# Patient Record
Sex: Female | Born: 1940 | Race: White | Hispanic: No | Marital: Married | Smoking: Former smoker
Health system: Southern US, Community
[De-identification: ages and names within clinical notes are randomized; demographics above are authoritative.]

## PROBLEM LIST (undated history)

## (undated) DIAGNOSIS — J479 Bronchiectasis, uncomplicated: Secondary | ICD-10-CM

## (undated) HISTORY — PX: ABDOMINAL HYSTERECTOMY: SHX81

## (undated) HISTORY — PX: URETHRAL SLING: SHX2621

---

## 2014-05-04 ENCOUNTER — Emergency Department (HOSPITAL_COMMUNITY): Payer: Medicare Other

## 2014-05-04 ENCOUNTER — Encounter (HOSPITAL_COMMUNITY): Payer: Self-pay | Admitting: Emergency Medicine

## 2014-05-04 ENCOUNTER — Inpatient Hospital Stay (HOSPITAL_COMMUNITY): Payer: Medicare Other

## 2014-05-04 ENCOUNTER — Inpatient Hospital Stay (HOSPITAL_COMMUNITY)
Admission: EM | Admit: 2014-05-04 | Discharge: 2014-05-06 | DRG: 069 | Disposition: A | Discharge status: Home / Self Care | Payer: Medicare Other | Attending: Internal Medicine | Admitting: Internal Medicine

## 2014-05-04 DIAGNOSIS — M949 Disorder of cartilage, unspecified: Secondary | ICD-10-CM

## 2014-05-04 DIAGNOSIS — J479 Bronchiectasis, uncomplicated: Secondary | ICD-10-CM | POA: Diagnosis present

## 2014-05-04 DIAGNOSIS — G459 Transient cerebral ischemic attack, unspecified: Secondary | ICD-10-CM

## 2014-05-04 DIAGNOSIS — R000 Tachycardia, unspecified: Secondary | ICD-10-CM | POA: Diagnosis present

## 2014-05-04 DIAGNOSIS — Z7982 Long term (current) use of aspirin: Secondary | ICD-10-CM

## 2014-05-04 DIAGNOSIS — M899 Disorder of bone, unspecified: Secondary | ICD-10-CM | POA: Diagnosis present

## 2014-05-04 DIAGNOSIS — I498 Other specified cardiac arrhythmias: Secondary | ICD-10-CM | POA: Diagnosis present

## 2014-05-04 DIAGNOSIS — E876 Hypokalemia: Secondary | ICD-10-CM | POA: Diagnosis present

## 2014-05-04 DIAGNOSIS — M6281 Muscle weakness (generalized): Secondary | ICD-10-CM

## 2014-05-04 DIAGNOSIS — Z87891 Personal history of nicotine dependence: Secondary | ICD-10-CM

## 2014-05-04 DIAGNOSIS — R531 Weakness: Secondary | ICD-10-CM

## 2014-05-04 DIAGNOSIS — D72829 Elevated white blood cell count, unspecified: Secondary | ICD-10-CM | POA: Diagnosis present

## 2014-05-04 DIAGNOSIS — Z79899 Other long term (current) drug therapy: Secondary | ICD-10-CM

## 2014-05-04 DIAGNOSIS — K5289 Other specified noninfective gastroenteritis and colitis: Secondary | ICD-10-CM | POA: Diagnosis present

## 2014-05-04 HISTORY — DX: Bronchiectasis, uncomplicated: J47.9

## 2014-05-04 LAB — COMPREHENSIVE METABOLIC PANEL
ALBUMIN: 4.6 g/dL (ref 3.5–5.2)
ALK PHOS: 57 U/L (ref 39–117)
ALT: 39 U/L — AB (ref 0–35)
AST: 52 U/L — ABNORMAL HIGH (ref 0–37)
BUN: 15 mg/dL (ref 6–23)
CO2: 27 mEq/L (ref 19–32)
Calcium: 10.3 mg/dL (ref 8.4–10.5)
Chloride: 99 mEq/L (ref 96–112)
Creatinine, Ser: 1.04 mg/dL (ref 0.50–1.10)
GFR calc Af Amer: 60 mL/min — ABNORMAL LOW (ref >90)
GFR calc non Af Amer: 52 mL/min — ABNORMAL LOW (ref >90)
Glucose, Bld: 112 mg/dL — ABNORMAL HIGH (ref 70–99)
POTASSIUM: 3.3 meq/L — AB (ref 3.7–5.3)
SODIUM: 142 meq/L (ref 137–147)
TOTAL PROTEIN: 8.1 g/dL (ref 6.0–8.3)
Total Bilirubin: 0.4 mg/dL (ref 0.3–1.2)

## 2014-05-04 LAB — CBC WITH DIFFERENTIAL/PLATELET
BASOS PCT: 0 % (ref 0–1)
Basophils Absolute: 0 10*3/uL (ref 0.0–0.1)
EOS ABS: 0 10*3/uL (ref 0.0–0.7)
Eosinophils Relative: 0 % (ref 0–5)
HCT: 44.7 % (ref 36.0–46.0)
Hemoglobin: 14.7 g/dL (ref 12.0–15.0)
Lymphocytes Relative: 16 % (ref 12–46)
Lymphs Abs: 1.7 10*3/uL (ref 0.7–4.0)
MCH: 31 pg (ref 26.0–34.0)
MCHC: 32.9 g/dL (ref 30.0–36.0)
MCV: 94.3 fL (ref 78.0–100.0)
Monocytes Absolute: 0.7 10*3/uL (ref 0.1–1.0)
Monocytes Relative: 6 % (ref 3–12)
NEUTROS PCT: 78 % — AB (ref 43–77)
Neutro Abs: 8.5 10*3/uL — ABNORMAL HIGH (ref 1.7–7.7)
Platelets: 376 10*3/uL (ref 150–400)
RBC: 4.74 MIL/uL (ref 3.87–5.11)
RDW: 14.3 % (ref 11.5–15.5)
WBC: 11 10*3/uL — ABNORMAL HIGH (ref 4.0–10.5)

## 2014-05-04 LAB — URINALYSIS, ROUTINE W REFLEX MICROSCOPIC
Bilirubin Urine: NEGATIVE
GLUCOSE, UA: NEGATIVE mg/dL
Hgb urine dipstick: NEGATIVE
Ketones, ur: NEGATIVE mg/dL
NITRITE: NEGATIVE
PH: 7 (ref 5.0–8.0)
Protein, ur: NEGATIVE mg/dL
Specific Gravity, Urine: 1.013 (ref 1.005–1.030)
Urobilinogen, UA: 0.2 mg/dL (ref 0.0–1.0)

## 2014-05-04 LAB — RAPID URINE DRUG SCREEN, HOSP PERFORMED
AMPHETAMINES: NOT DETECTED
Barbiturates: NOT DETECTED
Benzodiazepines: NOT DETECTED
COCAINE: NOT DETECTED
OPIATES: NOT DETECTED
TETRAHYDROCANNABINOL: NOT DETECTED

## 2014-05-04 LAB — URINE MICROSCOPIC-ADD ON

## 2014-05-04 LAB — APTT: aPTT: 33 seconds (ref 24–37)

## 2014-05-04 LAB — GLUCOSE, CAPILLARY: Glucose-Capillary: 104 mg/dL — ABNORMAL HIGH (ref 70–99)

## 2014-05-04 LAB — PROTIME-INR
INR: 0.9 (ref 0.00–1.49)
Prothrombin Time: 12 seconds (ref 11.6–15.2)

## 2014-05-04 MED ORDER — BECLOMETHASONE DIPROPIONATE 40 MCG/ACT IN AERS
2.0000 | INHALATION_SPRAY | Freq: Two times a day (BID) | RESPIRATORY_TRACT | Status: DC
  Filled 2014-05-04: qty 8.7

## 2014-05-04 MED ORDER — FLUTICASONE PROPIONATE 50 MCG/ACT NA SUSP
2.0000 | Freq: Every day | NASAL | Status: DC
  Administered 2014-05-05: 2 via NASAL
  Filled 2014-05-04: qty 16

## 2014-05-04 MED ORDER — POTASSIUM CHLORIDE CRYS ER 20 MEQ PO TBCR
30.0000 meq | EXTENDED_RELEASE_TABLET | Freq: Once | ORAL | Status: AC
  Administered 2014-05-04: 30 meq via ORAL
  Filled 2014-05-04: qty 1.5

## 2014-05-04 MED ORDER — SODIUM CHLORIDE 0.9 % IV SOLN
INTRAVENOUS | Status: DC
  Administered 2014-05-04: 21:00:00 via INTRAVENOUS

## 2014-05-04 MED ORDER — FLUTICASONE PROPIONATE HFA 44 MCG/ACT IN AERO
2.0000 | INHALATION_SPRAY | Freq: Two times a day (BID) | RESPIRATORY_TRACT | Status: DC
  Administered 2014-05-05 (×2): 2 via RESPIRATORY_TRACT
  Filled 2014-05-04: qty 10.6

## 2014-05-04 MED ORDER — FLUTICASONE PROPIONATE HFA 44 MCG/ACT IN AERO
1.0000 | INHALATION_SPRAY | Freq: Two times a day (BID) | RESPIRATORY_TRACT | Status: DC
  Administered 2014-05-04 – 2014-05-05 (×2): 1 via RESPIRATORY_TRACT
  Filled 2014-05-04: qty 10.6

## 2014-05-04 MED ORDER — NAPHAZOLINE HCL 0.1 % OP SOLN
2.0000 [drp] | Freq: Four times a day (QID) | OPHTHALMIC | Status: DC | PRN
  Filled 2014-05-04: qty 15

## 2014-05-04 MED ORDER — B COMPLEX PO TABS: 1.0000 | ORAL_TABLET | Freq: Every day | ORAL | Status: DC

## 2014-05-04 MED ORDER — ENOXAPARIN SODIUM 40 MG/0.4ML ~~LOC~~ SOLN
40.0000 mg | SUBCUTANEOUS | Status: DC
  Administered 2014-05-04 – 2014-05-05 (×2): 40 mg via SUBCUTANEOUS
  Filled 2014-05-04 (×3): qty 0.4

## 2014-05-04 MED ORDER — ASPIRIN EC 81 MG PO TBEC
81.0000 mg | DELAYED_RELEASE_TABLET | Freq: Every day | ORAL | Status: DC
  Administered 2014-05-05: 81 mg via ORAL
  Filled 2014-05-04 (×2): qty 1

## 2014-05-04 MED ORDER — GADOBENATE DIMEGLUMINE 529 MG/ML IV SOLN
10.0000 mL | Freq: Once | INTRAVENOUS | Status: AC | PRN
  Administered 2014-05-04: 10 mL via INTRAVENOUS

## 2014-05-04 MED ORDER — ASPIRIN 81 MG PO CHEW
324.0000 mg | CHEWABLE_TABLET | Freq: Once | ORAL | Status: AC
  Administered 2014-05-04: 324 mg via ORAL
  Filled 2014-05-04: qty 4

## 2014-05-04 MED ORDER — ADULT MULTIVITAMIN W/MINERALS CH
1.0000 | ORAL_TABLET | Freq: Every day | ORAL | Status: DC
  Administered 2014-05-05: 1 via ORAL
  Filled 2014-05-04 (×2): qty 1

## 2014-05-04 MED ORDER — ALBUTEROL SULFATE HFA 108 (90 BASE) MCG/ACT IN AERS: 2.0000 | INHALATION_SPRAY | RESPIRATORY_TRACT | Status: DC | PRN

## 2014-05-04 MED ORDER — B COMPLEX-C PO TABS
1.0000 | ORAL_TABLET | Freq: Every day | ORAL | Status: DC
  Administered 2014-05-04 – 2014-05-05 (×2): 1 via ORAL
  Filled 2014-05-04 (×3): qty 1

## 2014-05-04 MED ORDER — LORATADINE 10 MG PO TABS
10.0000 mg | ORAL_TABLET | Freq: Every day | ORAL | Status: DC
  Administered 2014-05-05: 10 mg via ORAL
  Filled 2014-05-04 (×2): qty 1

## 2014-05-04 NOTE — H&P (Signed)
History and Physical    [name] Wong:[id number]:[id number] DOB: [birth year] DOA: 05/04/2014  Referring physician: Dr. Ethelda ChickJacubowitz PCP: No primary provider on file.  Specialists: Neurology, Dr. Thad Wong  Chief Complaint: Right arm and right leg weakness and numbness  HPI: Robin Wong is a 73 y.o. female has a past medical history significant for bronchiectasis, osteopenia, presents to the emergency room chief complaint of right arm and right leg weakness and numbness that have been intermittent over the past 1.5 months, more so in the last couple weeks, and worse today at lunchtime when she could barely hold her fork. Her strength has gotten better, however her numbness in the right arm and right foot are still persistent in the emergency room. Patient doesn't describe the sensation is numbness, but states that it feels different "warm feeling" moving down on her right arm and leg. She never had an episode like this before. She denies any lightheadedness or dizziness. She denies any chest pain or breathing difficulties. She denies any facial asymmetry or slurred speech. She endorses palpitations at times including in the ED. She had 2 large coffees today and usually only drinks a light one.   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 She had mild stress recently as she has been trying to buy a house in the area.  She mentioned an episode of diarrhea today. She has been on antibiotics recently for a tooth problem and had an extraction, has been on Augmentin and stopped it 2 weeks ago.  She just located to the area with her husband, they lived near [place] and she has been [facility].  She tells me she has a history of bronchiectasis, diagnosed about 1-1/2 years ago as she was having persistent cough. She has been followed by pulmonology over there, and seems like she had extensive workup including a bronchoscopy with cultures that  were negative for MAI, TB or any other pathogens. She has been stable from a respiratory standpoint since her diagnosis. She has been started to use chest vest to help with her pulmonary secretions since last October and is wondering whether that might cause some of her symptoms  She also has a history of what sounds like hypercalciuria for which [facility] put her on hydrochlorothiazide.   In the emergency room, calcium on the upper limit of normal, mild hypokalemia mild leukocytosis and otherwise unremarkable. CT head with contrast without any acute findings. EDP discussed with neurologist on call, and patient will be admitted by the hospitalist group [facility] floor for further evaluation.  Review of Systems: As per history of present illness, otherwise negative  Past Medical History  Diagnosis Date  . Bronchiectasis    Past Surgical History  Procedure Laterality Date  . Abdominal hysterectomy    . Urethral sling      self caths   Social History:  reports that she has quit smoking. She does not have any smokeless tobacco history on file. She reports that she does not drink alcohol or use illicit drugs.  Allergies  Allergen Reactions  . Demerol [Meperidine] Nausea And Vomiting  . Doxycycline Nausea Only  . Levaquin [Levofloxacin] Other (See Comments)    Sleeplessness, nervous  . Sulfa Antibiotics     Childhood reaction     History reviewed. No pertinent family history.   Prior to Admission medications   Medication Sig Start Date End Date Taking? Authorizing Provider  Ascorbic Acid (VITAMIN C) 100 MG tablet Take 100 mg by mouth daily.   Yes Historical Provider, MD  aspirin EC 81 MG tablet Take 81 mg by mouth daily.   Yes Historical Provider, MD  b complex vitamins tablet Take 1 tablet by mouth daily.   Yes Historical Provider, MD  beclomethasone (QVAR) 40 MCG/ACT inhaler Inhale 2 puffs into the lungs 2 (two) times daily.   Yes Historical  Provider, MD  diphenhydramine-acetaminophen (TYLENOL PM) 25-500 MG TABS Take 2 tablets by mouth at bedtime as needed (for sleep).   Yes Historical Provider, MD  fluticasone (FLONASE) 50 MCG/ACT nasal spray Place 2 sprays into both nostrils daily.   Yes Historical Provider, MD  hydrochlorothiazide (MICROZIDE) 12.5 MG capsule Take 12.5 mg by mouth daily.   Yes Historical Provider, MD  loratadine (CLARITIN) 10 MG tablet Take 10 mg by mouth daily.   Yes Historical Provider, MD  Multiple Vitamin (MULTIVITAMIN WITH MINERALS) TABS tablet Take 1 tablet by mouth daily.   Yes Historical Provider, MD  Omega-3 Fatty Acids (FISH OIL) 1000 MG CAPS Take 3 capsules by mouth daily.   Yes Historical Provider, MD  potassium chloride (K-DUR,KLOR-CON) 10 MEQ tablet Take 10 mEq by mouth 2 (two) times daily.   Yes Historical Provider, MD  tetrahydrozoline-zinc (VISINE-AC) 0.05-0.25 % ophthalmic solution Place 2 drops into both eyes 2 (two) times daily as needed (for allergies).   Yes Historical Provider, MD  zolpidem (AMBIEN) 5 MG tablet Take 5 mg by mouth at bedtime as needed for sleep.   Yes Historical Provider, MD   Physical Exam: Filed Vitals:   05/04/14 1425  BP: 141/73  Pulse: 121  Temp: 98.1 F (36.7 C)  TempSrc: Oral  Resp: 20  SpO2: 100%    General:  No apparent distress, mildly anxious   Eyes: no scleral icterus  ENT: moist oropharynx  Neck: supple, no JVD  Cardiovascular: regular rate without MRG; 2+ peripheral pulses, tachycardic   Respiratory: Somewhat distant breath sounds, no wheezing, no rales   Abdomen: soft, non tender to palpation, positive bowel sounds, no guarding, no rebound  Skin: no rashes  Musculoskeletal: no peripheral edema  Psychiatric: normal mood and affect  Neurologic: Cranial nerves 2-12 grossly intact. No pronator drift. Muscle strength 5 out of 5 in all 4 extremities. Seems to have a sensation deficit on lateral forearm on the right to light touch.  Labs on  Admission:  Basic Metabolic Panel:  Recent Labs Lab 05/04/14 1600  NA 142  K 3.3*  CL 99  CO2 27  GLUCOSE 112*  BUN 15  CREATININE 1.04  CALCIUM 10.3   Liver Function Tests:  Recent Labs Lab 05/04/14 1600  AST 52*  ALT 39*  ALKPHOS 57  BILITOT 0.4  PROT 8.1  ALBUMIN 4.6   CBC:  Recent Labs Lab 05/04/14 1600  WBC 11.0*  NEUTROABS 8.5*  HGB 14.7  HCT 44.7  MCV 94.3  PLT 376   Radiological Exams on Admission: Dg Chest 2 View  05/04/2014   CLINICAL DATA:  EXTREMITY WEAKNESS  EXAM: CHEST  2 VIEW  COMPARISON:  None.  FINDINGS: The heart size and mediastinal contours are within normal limits. The lungs are hyperinflated, there is flattening of the hemidiaphragms and increased AP diameter of the chest. . Both lungs are clear. Bilateral breast implants. Degenerative changes in the lower thoracic spine.  IMPRESSION: No active cardiopulmonary disease.  COPD.   Electronically Signed   By: Salome HolmesHector  Cooper M.D.  On: 05/04/2014 16:45   Ct Head Wo Contrast  05/04/2014   CLINICAL DATA:  EXTREMITY WEAKNESS  EXAM: CT HEAD WITHOUT CONTRAST  TECHNIQUE: Contiguous axial images were obtained from the base of the skull through the vertex without intravenous contrast.  COMPARISON:  None.  FINDINGS: No acute intracranial abnormality. Specifically, no hemorrhage, hydrocephalus, mass lesion, acute infarction, or significant intracranial injury. No acute calvarial abnormality. Punctate areas of low attenuation temporoparietal region on the right. Consistent with areas of chronic lacunar infarction. Visualized paranasal sinuses and mastoid air cells are patent.  IMPRESSION: Chronic changes.  No acute intracranial abnormality.   Electronically Signed   By: Salome Holmes M.D.   On: 05/04/2014 16:25    EKG: Independently reviewed.  Assessment/Plan Active Problems:   TIA (transient ischemic attack)   Bronchiectasis   Sinus tachycardia   TIA - patient with intermittent motor deficits and  weakness of the right upper extremity. No motor deficits noted in the ED but may be a sensory defect present. Will admit for TIA workup - MRI/MRA - 2D echo - carotid dopplers - A1C, lipid panel - neuro to consult and make further recommendations  Bronchiectasis - stable respiratory status at this point, continue chest vest per RT, home medications.   Sinus tachycardia - will be monitored on telemetry overnight. Check TSH also given diarrhea.   Diarrhea - onset today, has been on antibiotics but have been discontinued 2 weeks ago. If is persists, may need C diff r/o. Gentle hydration overnight. Check TSH. ?mild dehydration based on Cr, monitor BMP in am.    Diet: NPO Fluids: NS DVT Prophylaxis: Lovenox  Code Status: Full  Family Communication: husband and son bedside  Disposition Plan: inpatient  Time spent: 51  This note has been created with Education officer, environmental. Any transcriptional errors are unintentional.   Kemp Gomes M. Elvera Lennox, MD [facility] Pager [phone]  If 7PM-7AM, please contact night-coverage [email or web address] Password [email or web address] 05/04/2014, 6:01 PM

## 2014-05-04 NOTE — ED Notes (Signed)
Attempted to call report to 4N, per secretary RN is taking report on another patient at this time. Asked to call back in 10-15 min.

## 2014-05-04 NOTE — ED Notes (Signed)
Admitting doctor at bedside 

## 2014-05-04 NOTE — ED Provider Notes (Signed)
Complains of intermittent right arm and right leg weakness for the past several weeks. Today she had trouble point. She denies pain. No other complaint. He says the weakness is minimal at present on exam patient is alert Glasgow Coma Score 15 HEENT exam no facial asymmetry neck supple trach midline no bruit or tachycardic neurologic Glasgow Coma Score 15 gait normal Romberg normal prior drift normal DTRs symmetric bilaterally knee jerk ankle jerk biceps was ordered bilaterally urine is normal.   Doug SouSam Cherrelle Plante, MD 05/04/14 1635

## 2014-05-04 NOTE — ED Provider Notes (Signed)
CSN: [id number]     Arrival date & time 05/04/14  1418 History   First MD Initiated Contact with Patient 05/04/14 1507     Chief Complaint  Patient presents with  . Extremity Weakness   HPI  History provided by the patient and family. Patient is a 73 year old female with history of bronchiectasis, osteopenia who presents with complaints of intermittent episodes of weakness in her right upper and lower extremities. Patient states she has been noticing symptoms intermittently for the past few weeks. Husband states she has complained for several months.  She reports she often feels a warm sensation moving down her arm or her leg followed by weakness that may last several minutes to hours. Today she was sitting at the table trying to hold a spoon and felt that the spoon was weak in her hand. Patient also will often have difficulty sleeping due to sensation. It may come and go at different times. She does not associate any specific aggravating or alleviating factor however she is concerned it may be related to chest stimulator which she began using last October. She uses this 3 times a day for 30 minutes to vibrate and loosen up secretions in the chest and lungs. She denies any associated back or neck pain. Weakness is not seen to occur following these treatments.  Patient also has mentioned feeling some jitteriness and shaking in her arms bilaterally today. She also reports feeling slightly increased heartbeat today but thinks it may be from drinking stronger coffee. She reports having 2 20 oz starbucks Coffee today. Husband states she normally drinks 1 watered-down type coffee at home. Patient also reports some increased stress with having to sale house and buy new one.  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 States that she is not used to being retired and has never had to go with buying a home before. [place].   [facility].   Past Medical History  Diagnosis Date  . Bronchiectasis    Past Surgical History  Procedure Laterality Date  . Abdominal hysterectomy    . Urethral sling      self caths   History reviewed. No pertinent family history. History  Substance Use Topics  . Smoking status: Former Games developermoker  . Smokeless tobacco: Not on file  . Alcohol Use: No   OB History   Grav Para Term Preterm Abortions TAB SAB Ect Mult Living                 Review of Systems  Constitutional: Negative for fever, chills, diaphoresis and unexpected weight change.  Eyes: Negative for visual disturbance.  Respiratory: Negative for cough and shortness of breath.   Cardiovascular: Negative for chest pain and palpitations.  Gastrointestinal: Negative for nausea, vomiting and abdominal pain.  Genitourinary: Negative for dysuria.  Neurological: Positive for tremors and weakness. Negative for dizziness, light-headedness and headaches.  Psychiatric/Behavioral: Negative for confusion.  All other systems reviewed and are negative.     Allergies  Demerol; Doxycycline; Levaquin; and Sulfa antibiotics  Home Medications   Prior to Admission medications   Medication Sig Start Date End Date Taking? Authorizing Provider  Ascorbic Acid (VITAMIN C) 100 MG tablet Take 100 mg by mouth daily.   Yes Historical Provider, MD  aspirin EC 81 MG tablet Take 81 mg by mouth daily.   Yes Historical Provider, MD  b complex vitamins tablet Take 1  tablet by mouth daily.   Yes Historical Provider, MD  beclomethasone (QVAR) 40 MCG/ACT inhaler Inhale 2 puffs into the lungs 2 (two) times daily.   Yes Historical Provider, MD  diphenhydramine-acetaminophen (TYLENOL PM) 25-500 MG TABS Take 2 tablets by mouth at bedtime as needed (for sleep).   Yes Historical Provider, MD  fluticasone (FLONASE) 50 MCG/ACT nasal spray Place 2 sprays into both nostrils daily.   Yes Historical Provider, MD  hydrochlorothiazide (MICROZIDE)  12.5 MG capsule Take 12.5 mg by mouth daily.   Yes Historical Provider, MD  loratadine (CLARITIN) 10 MG tablet Take 10 mg by mouth daily.   Yes Historical Provider, MD  Multiple Vitamin (MULTIVITAMIN WITH MINERALS) TABS tablet Take 1 tablet by mouth daily.   Yes Historical Provider, MD  Omega-3 Fatty Acids (FISH OIL) 1000 MG CAPS Take 3 capsules by mouth daily.   Yes Historical Provider, MD  potassium chloride (K-DUR,KLOR-CON) 10 MEQ tablet Take 10 mEq by mouth 2 (two) times daily.   Yes Historical Provider, MD  tetrahydrozoline-zinc (VISINE-AC) 0.05-0.25 % ophthalmic solution Place 2 drops into both eyes 2 (two) times daily as needed (for allergies).   Yes Historical Provider, MD  zolpidem (AMBIEN) 5 MG tablet Take 5 mg by mouth at bedtime as needed for sleep.   Yes Historical Provider, MD   BP 141/73  Pulse 121  Temp(Src) 98.1 F (36.7 C) (Oral)  Resp 20  SpO2 100% Physical Exam  Nursing note and vitals reviewed. Constitutional: She is oriented to person, place, and time. She appears well-developed and well-nourished. No distress.  HENT:  Head: Normocephalic and atraumatic.  Eyes: Conjunctivae and EOM are normal. Pupils are equal, round, and reactive to light.  Neck: Normal range of motion. Neck supple.  No meningeal signs  Cardiovascular: Regular rhythm.  Tachycardia present.   No murmur heard. Pulmonary/Chest: Effort normal and breath sounds normal. No respiratory distress. She has no wheezes. She has no rales.  Abdominal: Soft. There is no tenderness. There is no rigidity, no rebound, no guarding, no CVA tenderness and no tenderness at McBurney's point.  Musculoskeletal: Normal range of motion. She exhibits no edema and no tenderness.  Neurological: She is alert and oriented to person, place, and time. She has normal strength. She displays tremor. No cranial nerve deficit or sensory deficit. Gait normal.  Strength 5/5 in all extremities.  Mild resting tremor.  Skin: Skin is warm  and dry. No rash noted.  Psychiatric: She has a normal mood and affect. Her behavior is normal.    ED Course  Procedures   COORDINATION OF CARE:  Nursing notes reviewed. Vital signs reviewed. Initial pt interview and examination performed.   Filed Vitals:   05/04/14 1425  BP: 141/73  Pulse: 121  Temp: 98.1 F (36.7 C)  TempSrc: Oral  Resp: 20  SpO2: 100%    3:08 PM-patient seen and evaluated. She has a slight tremor and appears slightly anxious otherwise appears well. Does appear in any acute distress. She has a normal nonfocal neuro exam. Initial workup and a CT ordered. Patient does report history of a calloid cyst on past CT of head which was followed up with an MRI. She was told this was small and benign with nothing to do.  4:15PM Pt also discussed with Attending Physician.  4:45PM Spoke with on call neurology, Dr. Thad Ranger.  She recommends hospitalist admission with TIA rule out with transfer to [facility].  5:00PM Spoke with [facility], they will see pt and admit.  Results for orders placed during the hospital encounter of 05/04/14  CBC WITH DIFFERENTIAL      Result Value Ref Range   WBC 11.0 (*) 4.0 - 10.5 K/uL   RBC 4.74  3.87 - 5.11 MIL/uL   Hemoglobin 14.7  12.0 - 15.0 g/dL   HCT 16.144.7  09.636.0 - 04.546.0 %   MCV 94.3  78.0 - 100.0 fL   MCH 31.0  26.0 - 34.0 pg   MCHC 32.9  30.0 - 36.0 g/dL   RDW 40.914.3  81.111.5 - 91.415.5 %   Platelets 376  150 - 400 K/uL   Neutrophils Relative % 78 (*) 43 - 77 %   Neutro Abs 8.5 (*) 1.7 - 7.7 K/uL   Lymphocytes Relative 16  12 - 46 %   Lymphs Abs 1.7  0.7 - 4.0 K/uL   Monocytes Relative 6  3 - 12 %   Monocytes Absolute 0.7  0.1 - 1.0 K/uL   Eosinophils Relative 0  0 - 5 %   Eosinophils Absolute 0.0  0.0 - 0.7 K/uL   Basophils Relative 0  0 - 1 %   Basophils Absolute 0.0  0.0 - 0.1 K/uL  COMPREHENSIVE METABOLIC PANEL      Result Value Ref Range   Sodium 142  137 - 147 mEq/L   Potassium 3.3 (*) 3.7 - 5.3 mEq/L   Chloride 99  96  - 112 mEq/L   CO2 27  19 - 32 mEq/L   Glucose, Bld 112 (*) 70 - 99 mg/dL   BUN 15  6 - 23 mg/dL   Creatinine, Ser 7.821.04  0.50 - 1.10 mg/dL   Calcium 95.610.3  8.4 - 21.310.5 mg/dL   Total Protein 8.1  6.0 - 8.3 g/dL   Albumin 4.6  3.5 - 5.2 g/dL   AST PENDING  0 - 37 U/L   ALT 39 (*) 0 - 35 U/L   Alkaline Phosphatase 57  39 - 117 U/L   Total Bilirubin 0.4  0.3 - 1.2 mg/dL   GFR calc non Af Amer 52 (*) >90 mL/min   GFR calc Af Amer 60 (*) >90 mL/min  PROTIME-INR      Result Value Ref Range   Prothrombin Time 12.0  11.6 - 15.2 seconds   INR 0.90  0.00 - 1.49  APTT      Result Value Ref Range   aPTT 33  24 - 37 seconds  URINALYSIS, ROUTINE W REFLEX MICROSCOPIC      Result Value Ref Range   Color, Urine YELLOW  YELLOW   APPearance CLEAR  CLEAR   Specific Gravity, Urine 1.013  1.005 - 1.030   pH 7.0  5.0 - 8.0   Glucose, UA NEGATIVE  NEGATIVE mg/dL   Hgb urine dipstick NEGATIVE  NEGATIVE   Bilirubin Urine NEGATIVE  NEGATIVE   Ketones, ur NEGATIVE  NEGATIVE mg/dL   Protein, ur NEGATIVE  NEGATIVE mg/dL   Urobilinogen, UA 0.2  0.0 - 1.0 mg/dL   Nitrite NEGATIVE  NEGATIVE   Leukocytes, UA TRACE (*) NEGATIVE  URINE MICROSCOPIC-ADD ON      Result Value Ref Range   WBC, UA 0-2  <3 WBC/hpf       Imaging Review Dg Chest 2 View  05/04/2014   CLINICAL DATA:  EXTREMITY WEAKNESS  EXAM: CHEST  2 VIEW  COMPARISON:  None.  FINDINGS: The heart size and mediastinal contours are within normal limits. The lungs are hyperinflated, there is flattening of  the hemidiaphragms and increased AP diameter of the chest. . Both lungs are clear. Bilateral breast implants. Degenerative changes in the lower thoracic spine.  IMPRESSION: No active cardiopulmonary disease.  COPD.   Electronically Signed   By: Salome Holmes M.D.   On: 05/04/2014 16:45   Ct Head Wo Contrast  05/04/2014   CLINICAL DATA:  EXTREMITY WEAKNESS  EXAM: CT HEAD WITHOUT CONTRAST  TECHNIQUE: Contiguous axial images were obtained from the base of  the skull through the vertex without intravenous contrast.  COMPARISON:  None.  FINDINGS: No acute intracranial abnormality. Specifically, no hemorrhage, hydrocephalus, mass lesion, acute infarction, or significant intracranial injury. No acute calvarial abnormality. Punctate areas of low attenuation temporoparietal region on the right. Consistent with areas of chronic lacunar infarction. Visualized paranasal sinuses and mastoid air cells are patent.  IMPRESSION: Chronic changes.  No acute intracranial abnormality.   Electronically Signed   By: Salome Holmes M.D.   On: 05/04/2014 16:25     EKG Interpretation   Date/Time:  Sunday May 04 2014 15:38:12 EDT Ventricular Rate:  112 PR Interval:  154 QRS Duration: 77 QT Interval:  323 QTC Calculation: 441 R Axis:   -5 Text Interpretation:  Sinus tachycardia Biatrial enlargement RSR' in V1 or  V2, probably normal variant Minimal ST depression, inferior leads Baseline  wander in lead(s) V1 No old tracing to compare Confirmed by Ethelda Chick   MD, SAM [id number]) on 05/04/2014 3:45:23 PM      MDM   Final diagnoses:  Weakness of one side of body       Angus Seller, PA-C 05/04/14 1716

## 2014-05-04 NOTE — ED Notes (Signed)
Pt states weakness to right arm and right leg for several weeks..  No  Change in speech or orientation.  Pt ambulatory to triage.  Pt also describes other therapies involving med changes and lung vest..  Bronchiectasis.

## 2014-05-04 NOTE — ED Notes (Signed)
D/T pt description of right arm/leng weakness for several weeks, Notified Dr. Fredderick PhenixBelfi.  Head CT ordered.  Pt in bathroom at this time.

## 2014-05-04 NOTE — ED Notes (Signed)
Second attempt to draw blood and start an PIV, pt once again had to go to bathroom.

## 2014-05-04 NOTE — ED Provider Notes (Signed)
Medical screening examination/treatment/procedure(s) were conducted as a shared visit with non-physician practitioner(s) and myself.  I personally evaluated the patient during the encounter.   EKG Interpretation   Date/Time:  Sunday May 04 2014 15:38:12 EDT Ventricular Rate:  112 PR Interval:  154 QRS Duration: 77 QT Interval:  323 QTC Calculation: 441 R Axis:   -5 Text Interpretation:  Sinus tachycardia Biatrial enlargement RSR' in V1 or  V2, probably normal variant Minimal ST depression, inferior leads Baseline  wander in lead(s) V1 No old tracing to compare Confirmed by Ethelda ChickJACUBOWITZ   MD, Marytza Grandpre [phone]) on 05/04/2014 3:45:23 PM       Doug SouSam Gianpaolo Mindel, MD 05/04/14 2133

## 2014-05-05 DIAGNOSIS — G459 Transient cerebral ischemic attack, unspecified: Secondary | ICD-10-CM

## 2014-05-05 DIAGNOSIS — I498 Other specified cardiac arrhythmias: Secondary | ICD-10-CM

## 2014-05-05 DIAGNOSIS — J479 Bronchiectasis, uncomplicated: Secondary | ICD-10-CM

## 2014-05-05 LAB — BASIC METABOLIC PANEL
BUN: 15 mg/dL (ref 6–23)
CO2: 25 mEq/L (ref 19–32)
Calcium: 9.6 mg/dL (ref 8.4–10.5)
Chloride: 105 mEq/L (ref 96–112)
Creatinine, Ser: 0.73 mg/dL (ref 0.50–1.10)
GFR calc non Af Amer: 82 mL/min — ABNORMAL LOW (ref >90)
Glucose, Bld: 89 mg/dL (ref 70–99)
POTASSIUM: 3.4 meq/L — AB (ref 3.7–5.3)
SODIUM: 144 meq/L (ref 137–147)

## 2014-05-05 LAB — LIPID PANEL
CHOLESTEROL: 187 mg/dL (ref 0–200)
HDL: 99 mg/dL (ref >39)
LDL Cholesterol: 74 mg/dL (ref 0–99)
TRIGLYCERIDES: 70 mg/dL (ref <150)
Total CHOL/HDL Ratio: 1.9 RATIO
VLDL: 14 mg/dL (ref 0–40)

## 2014-05-05 LAB — CBC
HCT: 41.3 % (ref 36.0–46.0)
HEMOGLOBIN: 13.2 g/dL (ref 12.0–15.0)
MCH: 30.6 pg (ref 26.0–34.0)
MCHC: 32 g/dL (ref 30.0–36.0)
MCV: 95.6 fL (ref 78.0–100.0)
Platelets: 320 10*3/uL (ref 150–400)
RBC: 4.32 MIL/uL (ref 3.87–5.11)
RDW: 14.6 % (ref 11.5–15.5)
WBC: 10.5 10*3/uL (ref 4.0–10.5)

## 2014-05-05 LAB — HEMOGLOBIN A1C
Hgb A1c MFr Bld: 5.9 % — ABNORMAL HIGH (ref <5.7)
Mean Plasma Glucose: 123 mg/dL — ABNORMAL HIGH (ref <117)

## 2014-05-05 LAB — TSH: TSH: 7.61 u[IU]/mL — AB (ref 0.350–4.500)

## 2014-05-05 LAB — FOLATE

## 2014-05-05 LAB — VITAMIN B12: VITAMIN B 12: 1518 pg/mL — AB (ref 211–911)

## 2014-05-05 MED ORDER — POTASSIUM CHLORIDE CRYS ER 20 MEQ PO TBCR
40.0000 meq | EXTENDED_RELEASE_TABLET | ORAL | Status: AC
  Administered 2014-05-05: 40 meq via ORAL
  Filled 2014-05-05 (×2): qty 2

## 2014-05-05 MED ORDER — ZOLPIDEM TARTRATE 5 MG PO TABS
5.0000 mg | ORAL_TABLET | Freq: Once | ORAL | Status: AC
  Administered 2014-05-05: 5 mg via ORAL
  Filled 2014-05-05: qty 1

## 2014-05-05 MED ORDER — ZOLPIDEM TARTRATE 5 MG PO TABS
5.0000 mg | ORAL_TABLET | Freq: Every evening | ORAL | Status: DC | PRN
  Administered 2014-05-05: 5 mg via ORAL
  Filled 2014-05-05: qty 1

## 2014-05-05 NOTE — Evaluation (Signed)
Physical Therapy Evaluation Patient Details Name: Robin AbrahamsVirginia Jackson-Adams MRN: [id number] DOB: 13-Apr-1941 Today's Date: 05/05/2014   History of Present Illness  73 y.o. female [facility] with intermittent right arm and leg numbness and weakness.  Stroke workup is in the process with both MRI and CT negative for actue infarcts.  Dx with possible TIA.  Further cardiac workup is pending.  Pt at her baseline is very active works out daily and goes to [employer].  She and her husband are here visiting from [place] planning to move here soon.    Clinical Impression  Pt is progressing well with her mobility.  No obvious strength deficits right compared to left.  No coordination deficits.  Pt only lingering deficit is a vague sense of sensory changes in her right arm and right leg ("like I need to move them to get them to wake up").  Pt has no signs of balance deficits with DGI 24/24 and no functional strength deficits as she was able to ascend and descend stairs without a railing and self reported feeling equally strong in both right and left leg.  PT to sign off and PT screened for OT needs.  No OT needs at this time, so OT to sign off as well.      Follow Up Recommendations No PT follow up    Equipment Recommendations  None recommended by PT    Recommendations for Other Services   NA    Precautions / Restrictions Precautions Precautions: None      Mobility  Bed Mobility Overal bed mobility: Independent                Transfers Overall transfer level: Independent Equipment used: None                Ambulation/Gait Ambulation/Gait assistance: Independent Ambulation Distance (Feet): 450 Feet Assistive device: None Gait Pattern/deviations: WFL(Within Functional Limits)   Gait velocity interpretation: at or above normal speed for age/gender General Gait Details: Pt with good and likely much higher gait speed than her peers.   Stairs Stairs: Yes Stairs assistance:  Independent Stair Management: No rails;Alternating pattern;Forwards Number of Stairs: 10 General stair comments: Pt reports no difference in her strength in her legs when ascending and descending the stairs.       Modified Rankin (Stroke Patients Only) Modified Rankin (Stroke Patients Only) Pre-Morbid Rankin Score: No symptoms Modified Rankin: No significant disability     Balance Overall balance assessment: No apparent balance deficits (not formally assessed)                               Standardized Balance Assessment Standardized Balance Assessment : Dynamic Gait Index   Dynamic Gait Index Level Surface: Normal Change in Gait Speed: Normal Gait with Horizontal Head Turns: Normal Gait with Vertical Head Turns: Normal Gait and Pivot Turn: Normal Step Over Obstacle: Normal Step Around Obstacles: Normal Steps: Normal Total Score: 24       Pertinent Vitals/Pain See vitals flow sheet.     Home Living Family/patient expects to be discharged to:: Private residence Living Arrangements: Spouse/significant other Available Help at Discharge: Family;Available 24 hours/day Type of Home: House Home Access: Stairs to enter Entrance Stairs-Rails: None Entrance Stairs-Number of Steps: 2 Home Layout: Two level;Other (Comment) (office/bonus room upstairs) Home Equipment: None Additional Comments: Pt is currently staying in a hotel here in [place] as they are visiting, looking at houses.  Prior Function Level of Independence: Independent         Comments: drives, still works as a Adult nursewriter     Hand Dominance   Dominant Hand: Right    Extremity/Trunk Assessment   Upper Extremity Assessment: RUE deficits/detail RUE Deficits / Details: Pt with bil very red hands.  Cold to the touch.  Reports a sense that she needs to move her arm and leg on the right to "wake it up" from a sleep.  Did well with light touch sensory testing and felt the sensation was equal  bil.  Reported more ulnar nerve distribution sensation deficits at the end of our session.     RUE Sensation:  (did well with sensory testing, but vague sense of difference)     Lower Extremity Assessment: RLE deficits/detail;LLE deficits/detail RLE Deficits / Details: again, reporting the sense that she needs to move her right foot and ankle to get it to "wake up" similar to her right arm.      Cervical / Trunk Assessment: Other exceptions  Communication   Communication: No difficulties  Cognition Arousal/Alertness: Awake/alert Behavior During Therapy: WFL for tasks assessed/performed Overall Cognitive Status: Within Functional Limits for tasks assessed                      General Comments General comments (skin integrity, edema, etc.): Reviewed with pt and husband the importance of coming to the hosptial to get checked sooner rather than later after signs of a stroke present themselves.           Assessment/Plan    PT Assessment Patent does not need any further PT services           PT Goals (Current goals can be found in the Care Plan section) Acute Rehab PT Goals Patient Stated Goal: to go home, figure out why this is happening.  PT Goal Formulation: No goals set, d/c therapy     End of Session   Activity Tolerance: Patient tolerated treatment well Patient left: in bed;with family/visitor present (seated in bed, respiratory therapy coming in room) Nurse Communication: Mobility status;Other (comment) (no further PT/OT needs)         Time: [id number] PT Time Calculation (min): 42 min   Charges:   PT Evaluation $Initial PT Evaluation Tier I: 1 Procedure PT Treatments $Gait Training: 8-22 mins $Therapeutic Activity: 8-22 mins        Deric Bocock B. Mellissa Conley, PT, DPT [phone]   05/05/2014, 11:49 AM

## 2014-05-05 NOTE — Progress Notes (Signed)
Pt does not want bed alarm on. Informed of protocol. Walks well but Night RN made aware.

## 2014-05-05 NOTE — Progress Notes (Signed)
OT Cancellation Note  Patient Details Name: Robin Wong MRN: [id number] DOB: 1941/06/25   Cancelled Treatment:    Reason Eval/Treat Not Completed: OT screened, no needs identified, will sign off.  Pt notified OT that pt has no OT needs.   [name] OTR/L [phone] 05/05/2014, 11:37 AM

## 2014-05-05 NOTE — Progress Notes (Signed)
Patient Demographics  Robin AbrahamsVirginia Wong, is a 73 y.o. female, DOB - 1941/08/07, ZOX:[id number]  Admit date - 05/04/2014   Admitting Physician Costin Otelia SergeantM Gherghe, MD  Outpatient Primary MD for the patient is No primary provider on file.  LOS - 1   Chief Complaint  Patient presents with  . Extremity Weakness        Assessment & Plan    Right-sided tingling and numbness ? TIA - patient with intermittent motor deficits and weakness of the right upper extremity. No motor deficits noted, he has improved. MRI MRA brain nonacute, on home dose 81 mg aspirin daily, echogram, carotid duplex, A1c pending. Neurology to evaluate. Will have a one-time evaluation by PT-OT and speech as well.   Lab Results  Component Value Date   CHOL 187 05/05/2014   HDL 99 05/05/2014   LDLCALC 74 05/05/2014   TRIG 70 05/05/2014   CHOLHDL 1.9 05/05/2014    No results found for this basename: HGBA1C        Bronchiectasis - stable respiratory status at this point, continue chest vest per RT, home medications. Post discharge follow with her primary oncologist in [place].    Sinus tachycardia -  Resolved with hydration likely due to mild diarrhea.    Diarrhea - likely gastroenteritis, much improved, monitor.     Mildly elevated TSH. Repeat TSH, free T3 and T4 in 4 weeks by PCP in [place].    Low potassium. Replaced.    Code Status: Full  Family Communication: Husband bedside  Disposition Plan: Home   Procedures MRI MRA brain, MRA neck, echogram, carotid duplex pending.   Consults Neuro     Medications  Scheduled Meds: . aspirin EC  81 mg Oral Daily  . B-complex with vitamin C  1 tablet Oral Daily  . enoxaparin (LOVENOX) injection  40 mg Subcutaneous Q24H  . fluticasone  2 spray Each Nare  Daily  . fluticasone  1 puff Inhalation BID  . fluticasone  2 puff Inhalation BID  . loratadine  10 mg Oral Daily  . multivitamin with minerals  1 tablet Oral Daily  . potassium chloride  40 mEq Oral Q4H   Continuous Infusions: . sodium chloride 50 mL/hr at 05/04/14 2057   PRN Meds:.naphazoline  DVT Prophylaxis  Lovenox    Lab Results  Component Value Date   PLT 320 05/05/2014    Antibiotics     Anti-infectives   None          Subjective:   Robin Wong today has, No headache, No chest pain, No abdominal pain - No Nausea, No new weakness tingling or numbness, No Cough - SOB. Some tingling in R arm.  Objective:   Filed Vitals:   05/04/14 1912 05/04/14 2105 05/04/14 2201 05/05/14 0534  BP: 119/61  127/55 111/50  Pulse: 106 103 98 77  Temp:   99.4 F (37.4 C) 98.5 F (36.9 C)  TempSrc:   Oral Oral  Resp: 18 16 18 18   Height:   5\' 5"  (1.651 m)   Weight:   52.254 kg (115 lb 3.2 oz)   SpO2: 100% 100% 100% 97%    Wt Readings from Last 3 Encounters:  05/04/14 52.254 kg (115 lb 3.2 oz)  Intake/Output Summary (Last 24 hours) at 05/05/14 1023 Last data filed at 05/05/14 0139  Gross per 24 hour  Intake      0 ml  Output      1 ml  Net     -1 ml     Physical Exam  Awake Alert, Oriented X 3, No new F.N deficits, Normal affect [place].AT,PERRAL Supple Neck,No JVD, No cervical lymphadenopathy appriciated.  Symmetrical Chest wall movement, Good air movement bilaterally, CTAB RRR,No Gallops,Rubs or new Murmurs, No Parasternal Heave +ve B.Sounds, Abd Soft, Non tender, No organomegaly appriciated, No rebound - guarding or rigidity. No Cyanosis, Clubbing or edema, No new Rash or bruise      Data Review   Micro Results No results found for this or any previous visit (from the past 240 hour(s)).  Radiology Reports Dg Chest 2 View  05/04/2014   CLINICAL DATA:  EXTREMITY WEAKNESS  EXAM: CHEST  2 VIEW  COMPARISON:  None.  FINDINGS: The heart size and  mediastinal contours are within normal limits. The lungs are hyperinflated, there is flattening of the hemidiaphragms and increased AP diameter of the chest. . Both lungs are clear. Bilateral breast implants. Degenerative changes in the lower thoracic spine.  IMPRESSION: No active cardiopulmonary disease.  COPD.   Electronically Signed   By: Salome HolmesHector  Cooper M.D.   On: 05/04/2014 16:45   Ct Head Wo Contrast  05/04/2014   CLINICAL DATA:  EXTREMITY WEAKNESS  EXAM: CT HEAD WITHOUT CONTRAST  TECHNIQUE: Contiguous axial images were obtained from the base of the skull through the vertex without intravenous contrast.  COMPARISON:  None.  FINDINGS: No acute intracranial abnormality. Specifically, no hemorrhage, hydrocephalus, mass lesion, acute infarction, or significant intracranial injury. No acute calvarial abnormality. Punctate areas of low attenuation temporoparietal region on the right. Consistent with areas of chronic lacunar infarction. Visualized paranasal sinuses and mastoid air cells are patent.  IMPRESSION: Chronic changes.  No acute intracranial abnormality.   Electronically Signed   By: Salome HolmesHector  Cooper M.D.   On: 05/04/2014 16:25   Mr Angiogram Neck W Wo Contrast  05/04/2014   CLINICAL DATA:  TIA. Intermittent right upper and lower extremity weakness.  EXAM: MRI HEAD WITHOUT AND WITH CONTRAST  MRA HEAD WITHOUT CONTRAST  MRA NECK WITHOUT AND WITH CONTRAST  TECHNIQUE: Multiplanar, multiecho pulse sequences of the brain and surrounding structures were obtained without and with intravenous contrast. Angiographic images of the Circle of Willis were obtained using MRA technique without intravenous contrast. Angiographic images of the neck were obtained using MRA technique without and with intravenous contrast. Carotid stenosis measurements (when applicable) are obtained utilizing NASCET criteria, using the distal internal carotid diameter as the denominator.  CONTRAST:  10mL MULTIHANCE GADOBENATE DIMEGLUMINE 529  MG/ML IV SOLN  COMPARISON:  CT head from the same day.  FINDINGS: MRI HEAD FINDINGS  The diffusion-weighted images demonstrate no evidence for acute or subacute infarction. No hemorrhage or mass lesion is present. Minimal atrophy and white matter disease is within normal limits for age. The ventricles are of normal size. No significant extraaxial fluid collection is present.  Flow is present in the major intracranial arteries. The globes and orbits are intact. The paranasal sinuses and mastoid air cells are clear.  The postcontrast images demonstrate no pathologic enhancement.  MRA HEAD FINDINGS  The time-of-flight images are mildly degraded by patient motion. There is some tortuosity of the cervical internal carotid arteries bilaterally, left greater than right. 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 The distal ACA is bifurcated. The MCA bifurcations are intact. There is some attenuation of distal MCA branch vessels bilaterally.  The vertebral arteries are codominant. The PICA origins are visualized and normal. The basilar artery is normal. Both posterior cerebral arteries originate from the basilar tip. There is some attenuation of distal PCA branch vessels.  MRA NECK FINDINGS  A standard 3 vessel arch configuration is present. The vertebral arteries originate from the and subclavian arteries. There is no significant stenosis.  The right common carotid artery is within normal limits. The bifurcation is intact. There is mild tortuosity of the cervical right ICA without significant stenosis.  The left common carotid artery is within normal limits. The bifurcation is normal. Is moderate tortuosity of the cervical left ICA without significant stenosis.  IMPRESSION: 1. Normal MRI of the brain for age. 2. 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 3. Tortuosity of the cervical internal carotid arteries, left greater than right. There is no associated stenosis. This  is nonspecific, but most commonly seen in the setting of hypertension. 4. No significant intracranial or extracranial stenosis. These results were called by telephone at the time of interpretation on 05/04/2014 at 9:22 PM to Dr. Ethelda Chick , who verbally acknowledged these results.   Electronically Signed   By: Gennette Pac M.D.   On: 05/04/2014 21:22         CBC  Recent Labs Lab 05/04/14 1600 05/05/14 0504  WBC 11.0* 10.5  HGB 14.7 13.2  HCT 44.7 41.3  PLT 376 320  MCV 94.3 95.6  MCH 31.0 30.6  MCHC 32.9 32.0  RDW 14.3 14.6  LYMPHSABS 1.7  --   MONOABS 0.7  --   EOSABS 0.0  --   BASOSABS 0.0  --     Chemistries   Recent Labs Lab 05/04/14 1600 05/05/14 0504  NA 142 144  K 3.3* 3.4*  CL 99 105  CO2 27 25  GLUCOSE 112* 89  BUN 15 15  CREATININE 1.04 0.73  CALCIUM 10.3 9.6  AST 52*  --   ALT 39*  --   ALKPHOS 57  --   BILITOT 0.4  --    ------------------------------------------------------------------------------------------------------------------ estimated creatinine clearance is 50.9 ml/min (by C-G formula based on Cr of 0.73). ------------------------------------------------------------------------------------------------------------------ No results found for this basename: HGBA1C,  in the last 72 hours ------------------------------------------------------------------------------------------------------------------  Recent Labs  05/05/14 0504  CHOL 187  HDL 99  LDLCALC 74  TRIG 70  CHOLHDL 1.9   ------------------------------------------------------------------------------------------------------------------  Recent Labs  05/05/14 0504  TSH 7.610*   ------------------------------------------------------------------------------------------------------------------ No results found for this basename: VITAMINB12, FOLATE, FERRITIN, TIBC, IRON, RETICCTPCT,  in the last 72 hours  Coagulation profile  Recent Labs Lab 05/04/14 1600  INR 0.90     No results found for this basename: DDIMER,  in the last 72 hours  Cardiac Enzymes No results found for this basename: CK, CKMB, TROPONINI, MYOGLOBIN,  in the last 168 hours ------------------------------------------------------------------------------------------------------------------ No components found with this basename: POCBNP,      Time Spent in minutes  35   Leroy Sea M.D on 05/05/2014 at 10:23 AM  Between 7am to 7pm - Pager - [phone]  After 7pm go to [email or web address] - password [email or web address]  And look for the night coverage person covering for me after hours  [facility] Office  [phone]

## 2014-05-05 NOTE — Discharge Instructions (Signed)
Follow with Primary MD, primary physician and neurologist  in 7 days    Get CBC, CMP, checked 7 days by Primary MD and again as instructed by your Primary MD. Get a 2 view Chest X ray done next visit.   Activity: As tolerated with Full fall precautions use walker/cane & assistance as needed   Disposition Home.   Diet: Heart Healthy .  For Heart failure patients - Check your Weight same time everyday, if you gain over 2 pounds, or you develop in leg swelling, experience more shortness of breath or chest pain, call your Primary MD immediately. Follow Cardiac Low Salt Diet and 1.8 lit/day fluid restriction.   On your next visit with her primary care physician please Get Medicines reviewed and adjusted.  Please request your Prim.MD to go over all Hospital Tests and Procedure/Radiological results at the follow up, please get all Hospital records sent to your Prim MD by signing hospital release before you go home.   If you experience worsening of your admission symptoms, develop shortness of breath, life threatening emergency, suicidal or homicidal thoughts you must seek medical attention immediately by calling 911 or calling your MD immediately  if symptoms less severe.  You Must read complete instructions/literature along with all the possible adverse reactions/side effects for all the Medicines you take and that have been prescribed to you. Take any new Medicines after you have completely understood and accpet all the possible adverse reactions/side effects.   Do not drive and provide baby sitting services if your were admitted for syncope or siezures until you have seen by Primary MD or a Neurologist and advised to do so again.  Do not drive when taking Pain medications.    Do not take more than prescribed Pain, Sleep and Anxiety Medications  Special Instructions: If you have smoked or chewed Tobacco  in the last 2 yrs please stop smoking, stop any regular Alcohol  and or any  Recreational drug use.  Wear Seat belts while driving.   Please note  You were cared for by a hospitalist during your hospital stay. If you have any questions about your discharge medications or the care you received while you were in the hospital after you are discharged, you can call the unit and asked to speak with the hospitalist on call if the hospitalist that took care of you is not available. Once you are discharged, your primary care physician will handle any further medical issues. Please note that NO REFILLS for any discharge medications will be authorized once you are discharged, as it is imperative that you return to your primary care physician (or establish a relationship with a primary care physician if you do not have one) for your aftercare needs so that they can reassess your need for medications and monitor your lab values.

## 2014-05-05 NOTE — Consult Note (Signed)
NEURO HOSPITALIST CONSULT NOTE    Reason for Consult: right sided weakness  HPI:                                                                                                                                          Robin Wong is an 73 y.o. female, right handed, with a past medical history significant for bronchiectasis and osteopenia, transferred to [facility] for further evaluation of right sided weakness. She indicated that for the past few weeks she has been experiencing intermittent weakness involving the right arm amd leg that got worse and became a constant sensation of heaviness/weakness today, at times with numbness affecting the inner aspect of the right hand.She reports she often feels a warm sensation moving down her arm or her leg followed by weakness that may last several minutes to hours. Today she was sitting at the table trying to hold a spoon and felt that the spoon was weak in her hand. Patient also will often have difficulty sleeping due to sensation.She does not associate any specific aggravating or alleviating factor however she is concerned it may be related to chest stimulator which she began using last October. She uses this 3 times a day for 30 minutes to vibrate and loosen up secretions in the chest and lungs. Denies HA, vertigo, double vision, difficulty swallowing, slurred speech, imbalance, language or vision impairment. MRI/MRA brain done yesterday are unremarkable. No neck pain, bladder or bowel impairment. No head/neck trauma. Admits to increasing stress lately.  Past Medical History  Diagnosis Date  . Bronchiectasis     Past Surgical History  Procedure Laterality Date  . Abdominal hysterectomy    . Urethral sling      self caths    History reviewed. No pertinent family history.   Social History:  reports that she has quit smoking. She does not have any smokeless tobacco history on file. She reports that she does not drink  alcohol or use illicit drugs.  Allergies  Allergen Reactions  . Demerol [Meperidine] Nausea And Vomiting  . Doxycycline Nausea Only  . Levaquin [Levofloxacin] Other (See Comments)    Sleeplessness, nervous  . Sulfa Antibiotics     Childhood reaction     MEDICATIONS:  I have reviewed the patient's current medications.   ROS:                                                                                                                                       History obtained from the patient and chart review.  General ROS: negative for - chills, fatigue, fever, night sweats, weight gain or weight loss Psychological ROS: negative for - behavioral disorder, hallucinations, memory difficulties, mood swings or suicidal ideation Ophthalmic ROS: negative for - blurry vision, double vision, eye pain or loss of vision ENT ROS: negative for - epistaxis, nasal discharge, oral lesions, sore throat, tinnitus or vertigo Allergy and Immunology ROS: negative for - hives or itchy/watery eyes Hematological and Lymphatic ROS: negative for - bleeding problems, bruising or swollen lymph nodes Endocrine ROS: negative for - galactorrhea, hair pattern changes, polydipsia/polyuria or temperature intolerance Respiratory ROS: negative for - cough, hemoptysis, shortness of breath or wheezing Cardiovascular ROS: negative for - chest pain, edema or irregular heartbeat Gastrointestinal ROS: negative for - abdominal pain, diarrhea, hematemesis, nausea/vomiting or stool incontinence Genito-Urinary ROS: negative for - dysuria, hematuria, incontinence or urinary frequency/urgency Musculoskeletal ROS: negative for - joint swelling Neurological ROS: as noted in HPI Dermatological ROS: negative for rash and skin lesion changes  Physical exam: pleasant female in no apparent distress. Blood pressure 127/55,  pulse 98, temperature 99.4 F (37.4 C), temperature source Oral, resp. rate 18, height _0  (1.651 m), weight 52.254 kg (115 lb 3.2 oz), SpO2 100.00%. Head: normocephalic. Neck: supple, no bruits, no JVD. Cardiac: no murmurs. Lungs: clear. Abdomen: soft, no tender, no mass. Extremities: no edema. Neurologic Examination:                                                                                                      Mental Status: Alert, oriented, thought content appropriate.  Speech fluent without evidence of aphasia.  Able to follow 3 step commands without difficulty. Cranial Nerves: II: Discs flat bilaterally; Visual fields grossly normal, pupils equal, round, reactive to light and accommodation III,IV, VI: ptosis not present, extra-ocular motions intact bilaterally V,VII: smile symmetric, facial light touch sensation normal bilaterally VIII: hearing normal bilaterally IX,X: gag reflex present XI: bilateral shoulder shrug XII: midline tongue extension without atrophy or fasciculations  Motor: Right : Upper extremity   5/5    Left:     Upper extremity   5/5  Lower extremity   5/5     Lower extremity  5/5 Tone and bulk:normal tone throughout; no atrophy noted Sensory: Pinprick and light touch intact throughout, bilaterally Deep Tendon Reflexes:  Right: Upper Extremity   Left: Upper extremity   biceps (C-5 to C-6) 2/4   biceps (C-5 to C-6) 2/4 tricep (C7) 2/4    triceps (C7) 2/4 Brachioradialis (C6) 2/4  Brachioradialis (C6) 2/4  Lower Extremity Lower Extremity  quadriceps (L-2 to L-4) 2/4   quadriceps (L-2 to L-4) 2/4 Achilles (S1) 2/4   Achilles (S1) 2/4  Plantars: Right: downgoing   Left: downgoing Cerebellar: normal finger-to-nose,  normal heel-to-shin test Gait: no tested. CV: pulses palpable throughout    No results found for this basename: cbc, bmp, coags, chol, tri, ldl, hga1c    Results for orders placed during the hospital encounter of 05/04/14 (from the  past 48 hour(s))  URINALYSIS, ROUTINE W REFLEX MICROSCOPIC     Status: Abnormal   Collection Time    05/04/14  3:34 PM      Result Value Ref Range   Color, Urine YELLOW  YELLOW   APPearance CLEAR  CLEAR   Specific Gravity, Urine 1.013  1.005 - 1.030   pH 7.0  5.0 - 8.0   Glucose, UA NEGATIVE  NEGATIVE mg/dL   Hgb urine dipstick NEGATIVE  NEGATIVE   Bilirubin Urine NEGATIVE  NEGATIVE   Ketones, ur NEGATIVE  NEGATIVE mg/dL   Protein, ur NEGATIVE  NEGATIVE mg/dL   Urobilinogen, UA 0.2  0.0 - 1.0 mg/dL   Nitrite NEGATIVE  NEGATIVE   Leukocytes, UA TRACE (*) NEGATIVE  URINE MICROSCOPIC-ADD ON     Status: None   Collection Time    05/04/14  3:34 PM      Result Value Ref Range   WBC, UA 0-2  <3 WBC/hpf  URINE RAPID DRUG SCREEN (HOSP PERFORMED)     Status: None   Collection Time    05/04/14  3:34 PM      Result Value Ref Range   Opiates NONE DETECTED  NONE DETECTED   Cocaine NONE DETECTED  NONE DETECTED   Benzodiazepines NONE DETECTED  NONE DETECTED   Amphetamines NONE DETECTED  NONE DETECTED   Tetrahydrocannabinol NONE DETECTED  NONE DETECTED   Barbiturates NONE DETECTED  NONE DETECTED   Comment:            DRUG SCREEN FOR MEDICAL PURPOSES     ONLY.  IF CONFIRMATION IS NEEDED     FOR ANY PURPOSE, NOTIFY LAB     WITHIN 5 DAYS.                LOWEST DETECTABLE LIMITS     FOR URINE DRUG SCREEN     Drug Class       Cutoff (ng/mL)     Amphetamine      1000     Barbiturate      200     Benzodiazepine   315     Tricyclics       176     Opiates          300     Cocaine          300     THC              50  CBC WITH DIFFERENTIAL     Status: Abnormal   Collection Time    05/04/14  4:00 PM      Result Value Ref Range   WBC 11.0 (*) 4.0 - 10.5  K/uL   RBC 4.74  3.87 - 5.11 MIL/uL   Hemoglobin 14.7  12.0 - 15.0 g/dL   HCT 44.7  36.0 - 46.0 %   MCV 94.3  78.0 - 100.0 fL   MCH 31.0  26.0 - 34.0 pg   MCHC 32.9  30.0 - 36.0 g/dL   RDW 14.3  11.5 - 15.5 %   Platelets 376  150 - 400  K/uL   Neutrophils Relative % 78 (*) 43 - 77 %   Neutro Abs 8.5 (*) 1.7 - 7.7 K/uL   Lymphocytes Relative 16  12 - 46 %   Lymphs Abs 1.7  0.7 - 4.0 K/uL   Monocytes Relative 6  3 - 12 %   Monocytes Absolute 0.7  0.1 - 1.0 K/uL   Eosinophils Relative 0  0 - 5 %   Eosinophils Absolute 0.0  0.0 - 0.7 K/uL   Basophils Relative 0  0 - 1 %   Basophils Absolute 0.0  0.0 - 0.1 K/uL  COMPREHENSIVE METABOLIC PANEL     Status: Abnormal   Collection Time    05/04/14  4:00 PM      Result Value Ref Range   Sodium 142  137 - 147 mEq/L   Potassium 3.3 (*) 3.7 - 5.3 mEq/L   Chloride 99  96 - 112 mEq/L   CO2 27  19 - 32 mEq/L   Glucose, Bld 112 (*) 70 - 99 mg/dL   BUN 15  6 - 23 mg/dL   Creatinine, Ser 1.04  0.50 - 1.10 mg/dL   Calcium 10.3  8.4 - 10.5 mg/dL   Total Protein 8.1  6.0 - 8.3 g/dL   Albumin 4.6  3.5 - 5.2 g/dL   AST 52 (*) 0 - 37 U/L   Comment: SLIGHT HEMOLYSIS     HEMOLYSIS AT THIS LEVEL MAY AFFECT RESULT   ALT 39 (*) 0 - 35 U/L   Alkaline Phosphatase 57  39 - 117 U/L   Total Bilirubin 0.4  0.3 - 1.2 mg/dL   GFR calc non Af Amer 52 (*) >90 mL/min   GFR calc Af Amer 60 (*) >90 mL/min   Comment: (NOTE)     The eGFR has been calculated using the CKD EPI equation.     This calculation has not been validated in all clinical situations.     eGFR's persistently <90 mL/min signify possible Chronic Kidney     Disease.  PROTIME-INR     Status: None   Collection Time    05/04/14  4:00 PM      Result Value Ref Range   Prothrombin Time 12.0  11.6 - 15.2 seconds   INR 0.90  0.00 - 1.49  APTT     Status: None   Collection Time    05/04/14  4:00 PM      Result Value Ref Range   aPTT 33  24 - 37 seconds  GLUCOSE, CAPILLARY     Status: Abnormal   Collection Time    05/04/14 10:26 PM      Result Value Ref Range   Glucose-Capillary 104 (*) 70 - 99 mg/dL    Dg Chest 2 View  05/04/2014   CLINICAL DATA:  EXTREMITY WEAKNESS  EXAM: CHEST  2 VIEW  COMPARISON:  None.  FINDINGS: The heart  size and mediastinal contours are within normal limits. The lungs are hyperinflated, there is flattening of the hemidiaphragms and increased AP diameter of the chest. . Both  lungs are clear. Bilateral breast implants. Degenerative changes in the lower thoracic spine.  IMPRESSION: No active cardiopulmonary disease.  COPD.   Electronically Signed   By: Margaree Mackintosh M.D.   On: 05/04/2014 16:45   Ct Head Wo Contrast  05/04/2014   CLINICAL DATA:  EXTREMITY WEAKNESS  EXAM: CT HEAD WITHOUT CONTRAST  TECHNIQUE: Contiguous axial images were obtained from the base of the skull through the vertex without intravenous contrast.  COMPARISON:  None.  FINDINGS: No acute intracranial abnormality. Specifically, no hemorrhage, hydrocephalus, mass lesion, acute infarction, or significant intracranial injury. No acute calvarial abnormality. Punctate areas of low attenuation temporoparietal region on the right. Consistent with areas of chronic lacunar infarction. Visualized paranasal sinuses and mastoid air cells are patent.  IMPRESSION: Chronic changes.  No acute intracranial abnormality.   Electronically Signed   By: Margaree Mackintosh M.D.   On: 05/04/2014 16:25   Mr Angiogram Neck W Wo Contrast  05/04/2014   CLINICAL DATA:  TIA. Intermittent right upper and lower extremity weakness.  EXAM: MRI HEAD WITHOUT AND WITH CONTRAST  MRA HEAD WITHOUT CONTRAST  MRA NECK WITHOUT AND WITH CONTRAST  TECHNIQUE: Multiplanar, multiecho pulse sequences of the brain and surrounding structures were obtained without and with intravenous contrast. Angiographic images of the Circle of Willis were obtained using MRA technique without intravenous contrast. Angiographic images of the neck were obtained using MRA technique without and with intravenous contrast. Carotid stenosis measurements (when applicable) are obtained utilizing NASCET criteria, using the distal internal carotid diameter as the denominator.  CONTRAST:  59m MULTIHANCE GADOBENATE  DIMEGLUMINE 529 MG/ML IV SOLN  COMPARISON:  CT head from the same day.  FINDINGS: MRI HEAD FINDINGS  The diffusion-weighted images demonstrate no evidence for acute or subacute infarction. No hemorrhage or mass lesion is present. Minimal atrophy and white matter disease is within normal limits for age. The ventricles are of normal size. No significant extraaxial fluid collection is present.  Flow is present in the major intracranial arteries. The globes and orbits are intact. The paranasal sinuses and mastoid air cells are clear.  The postcontrast images demonstrate no pathologic enhancement.  MRA HEAD FINDINGS  The time-of-flight images are mildly degraded by patient motion. There is some tortuosity of the cervical internal carotid arteries bilaterally, left greater than right. 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 The distal ACA is bifurcated. The MCA bifurcations are intact. There is some attenuation of distal MCA branch vessels bilaterally.  The vertebral arteries are codominant. The PICA origins are visualized and normal. The basilar artery is normal. Both posterior cerebral arteries originate from the basilar tip. There is some attenuation of distal PCA branch vessels.  MRA NECK FINDINGS  A standard 3 vessel arch configuration is present. The vertebral arteries originate from the and subclavian arteries. There is no significant stenosis.  The right common carotid artery is within normal limits. The bifurcation is intact. There is mild tortuosity of the cervical right ICA without significant stenosis.  The left common carotid artery is within normal limits. The bifurcation is normal. Is moderate tortuosity of the cervical left ICA without significant stenosis.  IMPRESSION: 1. Normal MRI of the brain for age. 2. 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 3. Tortuosity of the cervical internal carotid arteries, left greater than right. There is no  associated stenosis. This is nonspecific, but most commonly seen in the setting of hypertension. 4. No significant intracranial  or extracranial stenosis. These results were called by telephone at the time of interpretation on 05/04/2014 at 9:22 PM to Dr. Winfred Leeds , who verbally acknowledged these results.   Electronically Signed   By: Lawrence Santiago M.D.   On: 05/04/2014 21:22   Mr Jeri Cos QV Contrast  05/04/2014   CLINICAL DATA:  TIA. Intermittent right upper and lower extremity weakness.  EXAM: MRI HEAD WITHOUT AND WITH CONTRAST  MRA HEAD WITHOUT CONTRAST  MRA NECK WITHOUT AND WITH CONTRAST  TECHNIQUE: Multiplanar, multiecho pulse sequences of the brain and surrounding structures were obtained without and with intravenous contrast. Angiographic images of the Circle of Willis were obtained using MRA technique without intravenous contrast. Angiographic images of the neck were obtained using MRA technique without and with intravenous contrast. Carotid stenosis measurements (when applicable) are obtained utilizing NASCET criteria, using the distal internal carotid diameter as the denominator.  CONTRAST:  12m MULTIHANCE GADOBENATE DIMEGLUMINE 529 MG/ML IV SOLN  COMPARISON:  CT head from the same day.  FINDINGS: MRI HEAD FINDINGS  The diffusion-weighted images demonstrate no evidence for acute or subacute infarction. No hemorrhage or mass lesion is present. Minimal atrophy and white matter disease is within normal limits for age. The ventricles are of normal size. No significant extraaxial fluid collection is present.  Flow is present in the major intracranial arteries. The globes and orbits are intact. The paranasal sinuses and mastoid air cells are clear.  The postcontrast images demonstrate no pathologic enhancement.  MRA HEAD FINDINGS  The time-of-flight images are mildly degraded by patient motion. There is some tortuosity of the cervical internal carotid arteries bilaterally, left greater than right. The left  A1 segment is aplastic an azygos and right A2 segment is present. The distal ACA is bifurcated. The MCA bifurcations are intact. There is some attenuation of distal MCA branch vessels bilaterally.  The vertebral arteries are codominant. The PICA origins are visualized and normal. The basilar artery is normal. Both posterior cerebral arteries originate from the basilar tip. There is some attenuation of distal PCA branch vessels.  MRA NECK FINDINGS  A standard 3 vessel arch configuration is present. The vertebral arteries originate from the and subclavian arteries. There is no significant stenosis.  The right common carotid artery is within normal limits. The bifurcation is intact. There is mild tortuosity of the cervical right ICA without significant stenosis.  The left common carotid artery is within normal limits. The bifurcation is normal. Is moderate tortuosity of the cervical left ICA without significant stenosis.  IMPRESSION: 1. Normal MRI of the brain for age. 2. 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 3. Tortuosity of the cervical internal carotid arteries, left greater than right. There is no associated stenosis. This is nonspecific, but most commonly seen in the setting of hypertension. 4. No significant intracranial or extracranial stenosis. These results were called by telephone at the time of interpretation on 05/04/2014 at 9:22 PM to Dr. JWinfred Leeds, who verbally acknowledged these results.   Electronically Signed   By: CLawrence SantiagoM.D.   On: 05/04/2014 21:22   Mr MJodene NamHead/brain Wo Cm  05/04/2014   CLINICAL DATA:  TIA. Intermittent right upper and lower extremity weakness.  EXAM: MRI HEAD WITHOUT AND WITH CONTRAST  MRA HEAD WITHOUT CONTRAST  MRA NECK WITHOUT AND WITH CONTRAST  TECHNIQUE: Multiplanar, multiecho pulse sequences of the brain and surrounding structures were obtained without and with intravenous contrast. Angiographic images of the Circle of Willis  were obtained  using MRA technique without intravenous contrast. Angiographic images of the neck were obtained using MRA technique without and with intravenous contrast. Carotid stenosis measurements (when applicable) are obtained utilizing NASCET criteria, using the distal internal carotid diameter as the denominator.  CONTRAST:  58m MULTIHANCE GADOBENATE DIMEGLUMINE 529 MG/ML IV SOLN  COMPARISON:  CT head from the same day.  FINDINGS: MRI HEAD FINDINGS  The diffusion-weighted images demonstrate no evidence for acute or subacute infarction. No hemorrhage or mass lesion is present. Minimal atrophy and white matter disease is within normal limits for age. The ventricles are of normal size. No significant extraaxial fluid collection is present.  Flow is present in the major intracranial arteries. The globes and orbits are intact. The paranasal sinuses and mastoid air cells are clear.  The postcontrast images demonstrate no pathologic enhancement.  MRA HEAD FINDINGS  The time-of-flight images are mildly degraded by patient motion. There is some tortuosity of the cervical internal carotid arteries bilaterally, left greater than right. 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 The distal ACA is bifurcated. The MCA bifurcations are intact. There is some attenuation of distal MCA branch vessels bilaterally.  The vertebral arteries are codominant. The PICA origins are visualized and normal. The basilar artery is normal. Both posterior cerebral arteries originate from the basilar tip. There is some attenuation of distal PCA branch vessels.  MRA NECK FINDINGS  A standard 3 vessel arch configuration is present. The vertebral arteries originate from the and subclavian arteries. There is no significant stenosis.  The right common carotid artery is within normal limits. The bifurcation is intact. There is mild tortuosity of the cervical right ICA without significant stenosis.  The left common carotid artery is  within normal limits. The bifurcation is normal. Is moderate tortuosity of the cervical left ICA without significant stenosis.  IMPRESSION: 1. Normal MRI of the brain for age. 2. 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 3. Tortuosity of the cervical internal carotid arteries, left greater than right. There is no associated stenosis. This is nonspecific, but most commonly seen in the setting of hypertension. 4. No significant intracranial or extracranial stenosis. These results were called by telephone at the time of interpretation on 05/04/2014 at 9:22 PM to Dr. JWinfred Leeds, who verbally acknowledged these results.   Electronically Signed   By: CLawrence SantiagoM.D.   On: 05/04/2014 21:22   Assessment/Plan: 73y/o with complains of right sided weakness for several weeks, normal neuro-exam, as well as unremarkable MRI-MRA brain. Reports increasing stress lately. Unclear etiology but the evolution/pattern of her symptoms is not quite typical for a TIA.  Will follow up   ODorian Pod MD 05/05/2014, 1:08 AM [facility]

## 2014-05-06 ENCOUNTER — Inpatient Hospital Stay (HOSPITAL_COMMUNITY): Payer: Medicare Other

## 2014-05-06 DIAGNOSIS — I519 Heart disease, unspecified: Secondary | ICD-10-CM

## 2014-05-06 LAB — POTASSIUM: Potassium: 3.8 mEq/L (ref 3.7–5.3)

## 2014-05-06 NOTE — Progress Notes (Signed)
UR complete.  Damarion Mendizabal RN, MSN 

## 2014-05-06 NOTE — Evaluation (Signed)
Clinical/Bedside Swallow Evaluation Patient Details  Name: Robin Wong MRN: [id number] Date of Birth: Apr 17, 1941  Today's Date: 05/06/2014 Time: 1400-1425 SLP Time Calculation (min): 25 min  Past Medical History:  Past Medical History  Diagnosis Date  . Bronchiectasis    Past Surgical History:  Past Surgical History  Procedure Laterality Date  . Abdominal hysterectomy    . Urethral sling      self caths   HPI:  73 year old female admitted 05/04/14 due to right side weakness. PMH significant for bronchiectasis.   Assessment / Plan / Recommendation Clinical Impression  No overt difficulty observed or reported with any consistency    Aspiration Risk  Mild    Diet Recommendation Thin liquid;Regular   Liquid Administration via: Straw;Cup Medication Administration: Whole meds with liquid Supervision: Patient able to self feed Compensations: Slow rate;Small sips/bites Postural Changes and/or Swallow Maneuvers: Seated upright 90 degrees    Other  Recommendations Oral Care Recommendations: Oral care BID   Follow Up Recommendations  None    Frequency and Duration        Pertinent Vitals/Pain VSS, no pain reported    SLP Swallow Goals  n/a   Swallow Study Prior Functional Status   no history of swallowing difficulty. Tolerating regular diet with thin liquids.    General Date of Onset: 05/04/14 HPI: 73 year old female admitted 05/04/14 due to right side weakness. PMH significant for bronchiectasis. Type of Study: Bedside swallow evaluation Previous Swallow Assessment: n/a Diet Prior to this Study: Regular;Thin liquids Temperature Spikes Noted: No Respiratory Status: Room air History of Recent Intubation: No Behavior/Cognition: Alert;Cooperative;Pleasant mood Oral Cavity - Dentition: Adequate natural dentition Self-Feeding Abilities: Able to feed self Patient Positioning:  (standing in room) Baseline Vocal Quality: Clear Volitional Cough:  Strong Volitional Swallow: Able to elicit    Oral/Motor/Sensory Function Overall Oral Motor/Sensory Function: Appears within functional limits for tasks assessed   Ice Chips Ice chips: Not tested   Thin Liquid Thin Liquid: Within functional limits    Nectar Thick Nectar Thick Liquid: Not tested   Honey Thick Honey Thick Liquid: Not tested   Puree Puree: Within functional limits Presentation: Self Fed;Spoon   Solid   GO    Solid: Within functional limits Presentation: Self Fed      Robin Wong, Medical City DentonMSP, CCC-SLP [phone]  Robin Wong 05/06/2014,2:32 PM

## 2014-05-06 NOTE — Progress Notes (Signed)
[facility]  2D Echocardiogram completed.  Brodi Kari L Rosealyn Little, RDCS 05/06/2014 9:40 AM

## 2014-05-06 NOTE — Discharge Summary (Signed)
[place] H[place], is a 73 y.o. female  DOB [birth year]  MRN [id number].  Admission date:  05/04/2014  Admitting Physician  Leatha Gildingostin M Gherghe, MD  Discharge Date:  05/06/2014   Primary MD  No primary provider on file.  Recommendations for primary care physician for things to follow:   Please make sure patient follows with local neurology, cardiology and pulmonary physician.  Note her carotid duplex ultrasound have her follow one time with local vascular surgeon.    Admission Diagnosis  Weakness of one side of body [728.87]   Discharge Diagnosis  Weakness of one side of body [728.87]    Active Problems:   TIA (transient ischemic attack)   Bronchiectasis   Sinus tachycardia      Past Medical History  Diagnosis Date  . Bronchiectasis     Past Surgical History  Procedure Laterality Date  . Abdominal hysterectomy    . Urethral sling      self caths     Discharge Condition: Stable   Follow UP  Follow-up Information   Follow up with Your primary care physician, pulmonary doctor. Local neurologist. Schedule an appointment as soon as possible for a visit in 1 week. (Your primary care physician, Cardiologist,pulmonary doctor. Local neurologist)         Discharge Instructions  and  Discharge Medications      Discharge Orders   Future Orders Complete By Expires   Diet - low sodium heart healthy  As directed    Discharge instructions  As directed    Scheduling Instructions:   Follow with Primary MD, primary physician and neurologist  in 7 days    Get CBC, CMP, checked 7 days by Primary MD and again as instructed by your Primary MD. Get a 2 view Chest X ray done next visit.   Activity: As tolerated with Full fall precautions use walker/cane & assistance as needed   Disposition Home.   Diet: Heart  Healthy .  For Heart failure patients - Check your Weight same time everyday, if you gain over 2 pounds, or you develop in leg swelling, experience more shortness of breath or chest pain, call your Primary MD immediately. Follow Cardiac Low Salt Diet and 1.8 lit/day fluid restriction.   On your next visit with her primary care physician please Get Medicines reviewed and adjusted.  Please request your Prim.MD to go over all Hospital Tests and Procedure/Radiological results at the follow up, please get all Hospital records sent to your Prim MD by signing hospital release before you go home.   If you experience worsening of your admission symptoms, develop shortness of breath, life threatening emergency, suicidal or homicidal thoughts you must seek medical attention immediately by calling 911 or calling your MD immediately  if symptoms less severe.  You Must read complete instructions/literature along with all the possible adverse reactions/side effects for all the Medicines you take and that have been prescribed to you. Take any new Medicines after you have completely understood and accpet all the possible adverse  reactions/side effects.   Do not drive and provide baby sitting services if your were admitted for syncope or siezures until you have seen by Primary MD or a Neurologist and advised to do so again.  Do not drive when taking Pain medications.    Do not take more than prescribed Pain, Sleep and Anxiety Medications  Special Instructions: If you have smoked or chewed Tobacco  in the last 2 yrs please stop smoking, stop any regular Alcohol  and or any Recreational drug use.  Wear Seat belts while driving.   Please note  You were cared for by a hospitalist during your hospital stay. If you have any questions about your discharge medications or the care you received while you were in the hospital after you are discharged, you can call the unit and asked to speak with the hospitalist on  call if the hospitalist that took care of you is not available. Once you are discharged, your primary care physician will handle any further medical issues. Please note that NO REFILLS for any discharge medications will be authorized once you are discharged, as it is imperative that you return to your primary care physician (or establish a relationship with a primary care physician if you do not have one) for your aftercare needs so that they can reassess your need for medications and monitor your lab values.   Increase activity slowly  As directed        Medication List         aspirin EC 81 MG tablet  Take 81 mg by mouth daily.     b complex vitamins tablet  Take 1 tablet by mouth daily.     beclomethasone 40 MCG/ACT inhaler  Commonly known as:  QVAR  Inhale 2 puffs into the lungs 2 (two) times daily.     diphenhydramine-acetaminophen 25-500 MG Tabs  Commonly known as:  TYLENOL PM  Take 2 tablets by mouth at bedtime as needed (for sleep).     Fish Oil 1000 MG Caps  Take 3 capsules by mouth daily.     fluticasone 50 MCG/ACT nasal spray  Commonly known as:  FLONASE  Place 2 sprays into both nostrils daily.     hydrochlorothiazide 12.5 MG capsule  Commonly known as:  MICROZIDE  Take 12.5 mg by mouth daily.     loratadine 10 MG tablet  Commonly known as:  CLARITIN  Take 10 mg by mouth daily.     multivitamin with minerals Tabs tablet  Take 1 tablet by mouth daily.     potassium chloride 10 MEQ tablet  Commonly known as:  K-DUR,KLOR-CON  Take 10 mEq by mouth 2 (two) times daily.     tetrahydrozoline-zinc 0.05-0.25 % ophthalmic solution  Commonly known as:  VISINE-AC  Place 2 drops into both eyes 2 (two) times daily as needed (for allergies).     vitamin C 100 MG tablet  Take 100 mg by mouth daily.     zolpidem 5 MG tablet  Commonly known as:  AMBIEN  Take 5 mg by mouth at bedtime as needed for sleep.          Diet and Activity recommendation: See Discharge  Instructions above   Consults obtained - Neuro   Major procedures and Radiology Reports - PLEASE review detailed and final reports for all details, in brief -   Echo  Left ventricle: The cavity size was normal. Systolic function was normal. The estimated ejection fraction was in the range  of 55% to 60%. Wall motion was normal; there were no regional wall motion abnormalities. Doppler parameters are consistent with abnormal left ventricular relaxation (grade 1 diastolic dysfunction).     Carotid  Bilateral: Intimal wall thickening CCA. Mild soft plaque origin ICA. 1-39% ICA stenosis.Vertebral artery flow is antegrade.    MRI C Spine  Mild multilevel degenerative disc disease and moderate to advanced multilevel facet arthrosis resulting in moderate multilevel neural  foraminal stenosis as above. Neural foraminal narrowing on the right is greatest at C4-5.      Dg Chest 2 View  05/04/2014   CLINICAL DATA:  EXTREMITY WEAKNESS  EXAM: CHEST  2 VIEW  COMPARISON:  None.  FINDINGS: The heart size and mediastinal contours are within normal limits. The lungs are hyperinflated, there is flattening of the hemidiaphragms and increased AP diameter of the chest. . Both lungs are clear. Bilateral breast implants. Degenerative changes in the lower thoracic spine.  IMPRESSION: No active cardiopulmonary disease.  COPD.   Electronically Signed   By: Salome Holmes M.D.   On: 05/04/2014 16:45   Ct Head Wo Contrast  05/04/2014   CLINICAL DATA:  EXTREMITY WEAKNESS  EXAM: CT HEAD WITHOUT CONTRAST  TECHNIQUE: Contiguous axial images were obtained from the base of the skull through the vertex without intravenous contrast.  COMPARISON:  None.  FINDINGS: No acute intracranial abnormality. Specifically, no hemorrhage, hydrocephalus, mass lesion, acute infarction, or significant intracranial injury. No acute calvarial abnormality. Punctate areas of low attenuation temporoparietal region on the right. Consistent  with areas of chronic lacunar infarction. Visualized paranasal sinuses and mastoid air cells are patent.  IMPRESSION: Chronic changes.  No acute intracranial abnormality.   Electronically Signed   By: Salome Holmes M.D.   On: 05/04/2014 16:25   Mr Angiogram Neck W Wo Contrast  05/04/2014   CLINICAL DATA:  TIA. Intermittent right upper and lower extremity weakness.  EXAM: MRI HEAD WITHOUT AND WITH CONTRAST  MRA HEAD WITHOUT CONTRAST  MRA NECK WITHOUT AND WITH CONTRAST  TECHNIQUE: Multiplanar, multiecho pulse sequences of the brain and surrounding structures were obtained without and with intravenous contrast. Angiographic images of the Circle of Willis were obtained using MRA technique without intravenous contrast. Angiographic images of the neck were obtained using MRA technique without and with intravenous contrast. Carotid stenosis measurements (when applicable) are obtained utilizing NASCET criteria, using the distal internal carotid diameter as the denominator.  CONTRAST:  10mL MULTIHANCE GADOBENATE DIMEGLUMINE 529 MG/ML IV SOLN  COMPARISON:  CT head from the same day.  FINDINGS: MRI HEAD FINDINGS  The diffusion-weighted images demonstrate no evidence for acute or subacute infarction. No hemorrhage or mass lesion is present. Minimal atrophy and white matter disease is within normal limits for age. The ventricles are of normal size. No significant extraaxial fluid collection is present.  Flow is present in the major intracranial arteries. The globes and orbits are intact. The paranasal sinuses and mastoid air cells are clear.  The postcontrast images demonstrate no pathologic enhancement.  MRA HEAD FINDINGS  The time-of-flight images are mildly degraded by patient motion. There is some tortuosity of the cervical internal carotid arteries bilaterally, left greater than right. 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 The distal ACA is bifurcated. The MCA bifurcations are  intact. There is some attenuation of distal MCA branch vessels bilaterally.  The vertebral arteries are codominant. The PICA origins are visualized and normal. The basilar artery is normal. Both posterior  cerebral arteries originate from the basilar tip. There is some attenuation of distal PCA branch vessels.  MRA NECK FINDINGS  A standard 3 vessel arch configuration is present. The vertebral arteries originate from the and subclavian arteries. There is no significant stenosis.  The right common carotid artery is within normal limits. The bifurcation is intact. There is mild tortuosity of the cervical right ICA without significant stenosis.  The left common carotid artery is within normal limits. The bifurcation is normal. Is moderate tortuosity of the cervical left ICA without significant stenosis.  IMPRESSION: 1. Normal MRI of the brain for age. 2. 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 3. Tortuosity of the cervical internal carotid arteries, left greater than right. There is no associated stenosis. This is nonspecific, but most commonly seen in the setting of hypertension. 4. No significant intracranial or extracranial stenosis. These results were called by telephone at the time of interpretation on 05/04/2014 at 9:22 PM to Dr. Ethelda Chick , who verbally acknowledged these results.   Electronically Signed   By: Gennette Pac M.D.   On: 05/04/2014 21:22   Mr Laqueta Jean ZO Contrast  05/04/2014   CLINICAL DATA:  TIA. Intermittent right upper and lower extremity weakness.  EXAM: MRI HEAD WITHOUT AND WITH CONTRAST  MRA HEAD WITHOUT CONTRAST  MRA NECK WITHOUT AND WITH CONTRAST  TECHNIQUE: Multiplanar, multiecho pulse sequences of the brain and surrounding structures were obtained without and with intravenous contrast. Angiographic images of the Circle of Willis were obtained using MRA technique without intravenous contrast. Angiographic images of the neck were obtained using MRA technique without  and with intravenous contrast. Carotid stenosis measurements (when applicable) are obtained utilizing NASCET criteria, using the distal internal carotid diameter as the denominator.  CONTRAST:  10mL MULTIHANCE GADOBENATE DIMEGLUMINE 529 MG/ML IV SOLN  COMPARISON:  CT head from the same day.  FINDINGS: MRI HEAD FINDINGS  The diffusion-weighted images demonstrate no evidence for acute or subacute infarction. No hemorrhage or mass lesion is present. Minimal atrophy and white matter disease is within normal limits for age. The ventricles are of normal size. No significant extraaxial fluid collection is present.  Flow is present in the major intracranial arteries. The globes and orbits are intact. The paranasal sinuses and mastoid air cells are clear.  The postcontrast images demonstrate no pathologic enhancement.  MRA HEAD FINDINGS  The time-of-flight images are mildly degraded by patient motion. There is some tortuosity of the cervical internal carotid arteries bilaterally, left greater than right. 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 The distal ACA is bifurcated. The MCA bifurcations are intact. There is some attenuation of distal MCA branch vessels bilaterally.  The vertebral arteries are codominant. The PICA origins are visualized and normal. The basilar artery is normal. Both posterior cerebral arteries originate from the basilar tip. There is some attenuation of distal PCA branch vessels.  MRA NECK FINDINGS  A standard 3 vessel arch configuration is present. The vertebral arteries originate from the and subclavian arteries. There is no significant stenosis.  The right common carotid artery is within normal limits. The bifurcation is intact. There is mild tortuosity of the cervical right ICA without significant stenosis.  The left common carotid artery is within normal limits. The bifurcation is normal. Is moderate tortuosity of the cervical left ICA without significant  stenosis.  IMPRESSION: 1. Normal MRI of the brain for age. 2. Aplastic left A1 segment with an  azygos ACA, a normal variant. 3. Tortuosity of the cervical internal carotid arteries, left greater than right. There is no associated stenosis. This is nonspecific, but most commonly seen in the setting of hypertension. 4. No significant intracranial or extracranial stenosis. These results were called by telephone at the time of interpretation on 05/04/2014 at 9:22 PM to Dr. Ethelda Chick , who verbally acknowledged these results.   Electronically Signed   By: Gennette Pac M.D.   On: 05/04/2014 21:22   Mr Maxine Glenn Head/brain Wo Cm  05/04/2014   CLINICAL DATA:  TIA. Intermittent right upper and lower extremity weakness.  EXAM: MRI HEAD WITHOUT AND WITH CONTRAST  MRA HEAD WITHOUT CONTRAST  MRA NECK WITHOUT AND WITH CONTRAST  TECHNIQUE: Multiplanar, multiecho pulse sequences of the brain and surrounding structures were obtained without and with intravenous contrast. Angiographic images of the Circle of Willis were obtained using MRA technique without intravenous contrast. Angiographic images of the neck were obtained using MRA technique without and with intravenous contrast. Carotid stenosis measurements (when applicable) are obtained utilizing NASCET criteria, using the distal internal carotid diameter as the denominator.  CONTRAST:  10mL MULTIHANCE GADOBENATE DIMEGLUMINE 529 MG/ML IV SOLN  COMPARISON:  CT head from the same day.  FINDINGS: MRI HEAD FINDINGS  The diffusion-weighted images demonstrate no evidence for acute or subacute infarction. No hemorrhage or mass lesion is present. Minimal atrophy and white matter disease is within normal limits for age. The ventricles are of normal size. No significant extraaxial fluid collection is present.  Flow is present in the major intracranial arteries. The globes and orbits are intact. The paranasal sinuses and mastoid air cells are clear.  The postcontrast images demonstrate no  pathologic enhancement.  MRA HEAD FINDINGS  The time-of-flight images are mildly degraded by patient motion. There is some tortuosity of the cervical internal carotid arteries bilaterally, left greater than right. 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 The distal ACA is bifurcated. The MCA bifurcations are intact. There is some attenuation of distal MCA branch vessels bilaterally.  The vertebral arteries are codominant. The PICA origins are visualized and normal. The basilar artery is normal. Both posterior cerebral arteries originate from the basilar tip. There is some attenuation of distal PCA branch vessels.  MRA NECK FINDINGS  A standard 3 vessel arch configuration is present. The vertebral arteries originate from the and subclavian arteries. There is no significant stenosis.  The right common carotid artery is within normal limits. The bifurcation is intact. There is mild tortuosity of the cervical right ICA without significant stenosis.  The left common carotid artery is within normal limits. The bifurcation is normal. Is moderate tortuosity of the cervical left ICA without significant stenosis.  IMPRESSION: 1. Normal MRI of the brain for age. 2. 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 3. Tortuosity of the cervical internal carotid arteries, left greater than right. There is no associated stenosis. This is nonspecific, but most commonly seen in the setting of hypertension. 4. No significant intracranial or extracranial stenosis. These results were called by telephone at the time of interpretation on 05/04/2014 at 9:22 PM to Dr. Ethelda Chick , who verbally acknowledged these results.   Electronically Signed   By: Gennette Pac M.D.   On: 05/04/2014 21:22    Micro Results      No results found for this or any previous visit (from the past 240 hour(s)).   History of present illness  and  Hospital Course:     Kindly see H&P for history of present  illness and admission details, please review complete Labs, Consult reports and Test reports for all details in brief [place], is a 73 y.o. female, patient with history of   bronchiectasis, osteopenia, presents to the emergency room chief complaint of right arm and right leg weakness and numbness that have been intermittent over the past 1.5 months, more so in the last couple weeks, and worse today at lunchtime when she could barely hold her fork. Her strength has gotten better, however her numbness in the right arm and right foot are still persistent in the emergency room. Patient doesn't describe the sensation is numbness, but states that it feels different "warm feeling" moving down on her right arm and leg. She never had an episode like this before. She denied any lightheadedness or dizziness. She denied any chest pain or breathing difficulties. She denied any facial asymmetry or slurred speech.        Right-sided tingling and numbness ? TIA - patient with intermittent deficits and weakness of the right upper extremity. No motor deficits noted on exam, his close to her baseline and much improved. MRI MRA brain nonacute, on home dose 81 mg aspirin daily will be continued, stable echogram-  A1c pending - lipid panel. Seen by by PT-OT and speech as well. ? If C spine Neural foraminal narrowing on the right  at C4-5 causing her symptoms, continue to monitor, may need N.Surg eval 1 time post discharge.   Carotid duplex noted discussed with vascular surgeon Dr. Arbie Cookey, continue aspirin for secondary prevention follow with local vascular surgeon in town.     Lab Results   Component  Value  Date    CHOL  187  05/05/2014    HDL  99  05/05/2014    LDLCALC  74  05/05/2014    TRIG  70  05/05/2014    CHOLHDL  1.9  05/05/2014     Lab Results  Component Value Date   HGBA1C 5.9* 05/05/2014       Bronchiectasis - stable respiratory status at this point, continue chest vest per RT, home  medications. Post discharge follow with her primary oncologist in [place].     Sinus tachycardia - Resolved with hydration likely due to mild diarrhea.  All was local cardiologist outpatient post discharge.    Diarrhea - likely gastroenteritis, much improved, monitor.     Mildly elevated TSH around 7.6. Repeat TSH, free T3 and T4 in 4 weeks by PCP in [place].     Low potassium. Replaced.    MRI C Spine - Neural foraminal narrowing on the right is greatest at C4-5, outpt follow up.      Today   Subjective:   [place] today has no headache,no chest abdominal pain,no new weakness tingling or numbness, feels much better wants to go home today.   Objective:   Blood pressure 119/80, pulse 97, temperature 98 F (36.7 C), temperature source Oral, resp. rate 18, height 5\' 5"  (1.651 m), weight 52.254 kg (115 lb 3.2 oz), SpO2 100.00%.   Intake/Output Summary (Last 24 hours) at 05/06/14 1300 Last data filed at 05/06/14 0900  Gross per 24 hour  Intake    600 ml  Output      0 ml  Net    600 ml    Exam Awake Alert, Oriented *3, No new F.N deficits, Normal affect [place].AT,PERRAL Supple Neck,No JVD, No cervical  lymphadenopathy appriciated.  Symmetrical Chest wall movement, Good air movement bilaterally, CTAB RRR,No Gallops,Rubs or new Murmurs, No Parasternal Heave +ve B.Sounds, Abd Soft, Non tender, No organomegaly appriciated, No rebound -guarding or rigidity. No Cyanosis, Clubbing or edema, No new Rash or bruise  Data Review   CBC w Diff: Lab Results  Component Value Date   WBC 10.5 05/05/2014   HGB 13.2 05/05/2014   HCT 41.3 05/05/2014   PLT 320 05/05/2014   LYMPHOPCT 16 05/04/2014   MONOPCT 6 05/04/2014   EOSPCT 0 05/04/2014   BASOPCT 0 05/04/2014    CMP: Lab Results  Component Value Date   NA 144 05/05/2014   K 3.8 05/06/2014   CL 105 05/05/2014   CO2 25 05/05/2014   BUN 15 05/05/2014   CREATININE 0.73 05/05/2014   PROT 8.1 05/04/2014   ALBUMIN 4.6  05/04/2014   BILITOT 0.4 05/04/2014   ALKPHOS 57 05/04/2014   AST 52* 05/04/2014   ALT 39* 05/04/2014  . Lab Results  Component Value Date   HGBA1C 5.9* 05/05/2014    Lab Results  Component Value Date   CHOL 187 05/05/2014   HDL 99 05/05/2014   LDLCALC 74 05/05/2014   TRIG 70 05/05/2014   CHOLHDL 1.9 05/05/2014    Lab Results  Component Value Date   TSH 7.610* 05/05/2014    Total Time in preparing paper work, data evaluation and todays exam - 35 minutes  Leroy Sea M.D on 05/06/2014 at 1:00 PM  [facility] Office  [phone]

## 2014-12-17 IMAGING — CR DG CHEST 2V
2 series · 2 of 2 positions shown · non-contrast
Comparison: None.

CLINICAL DATA: EXTREMITY WEAKNESS

EXAM:
CHEST  2 VIEW

[w chest pa]
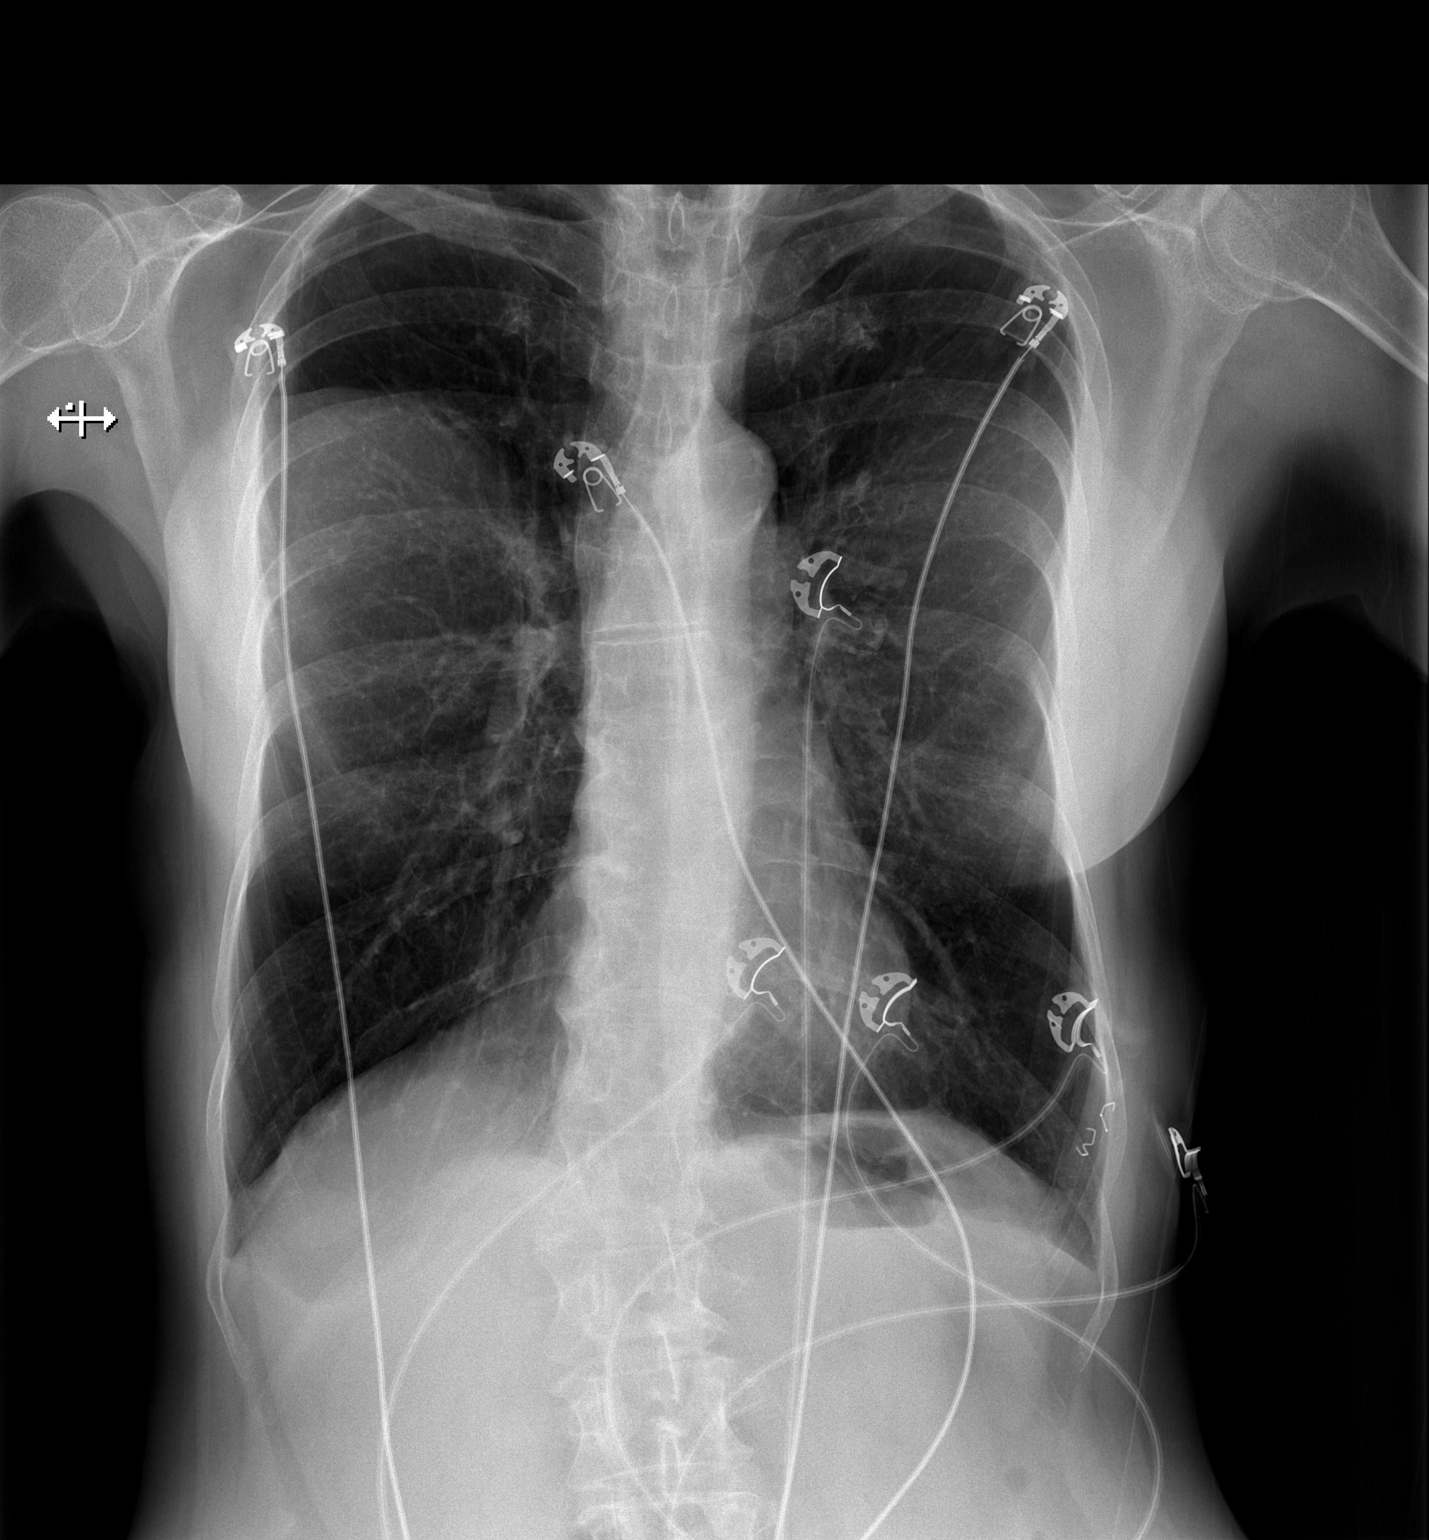

[w chest lat]
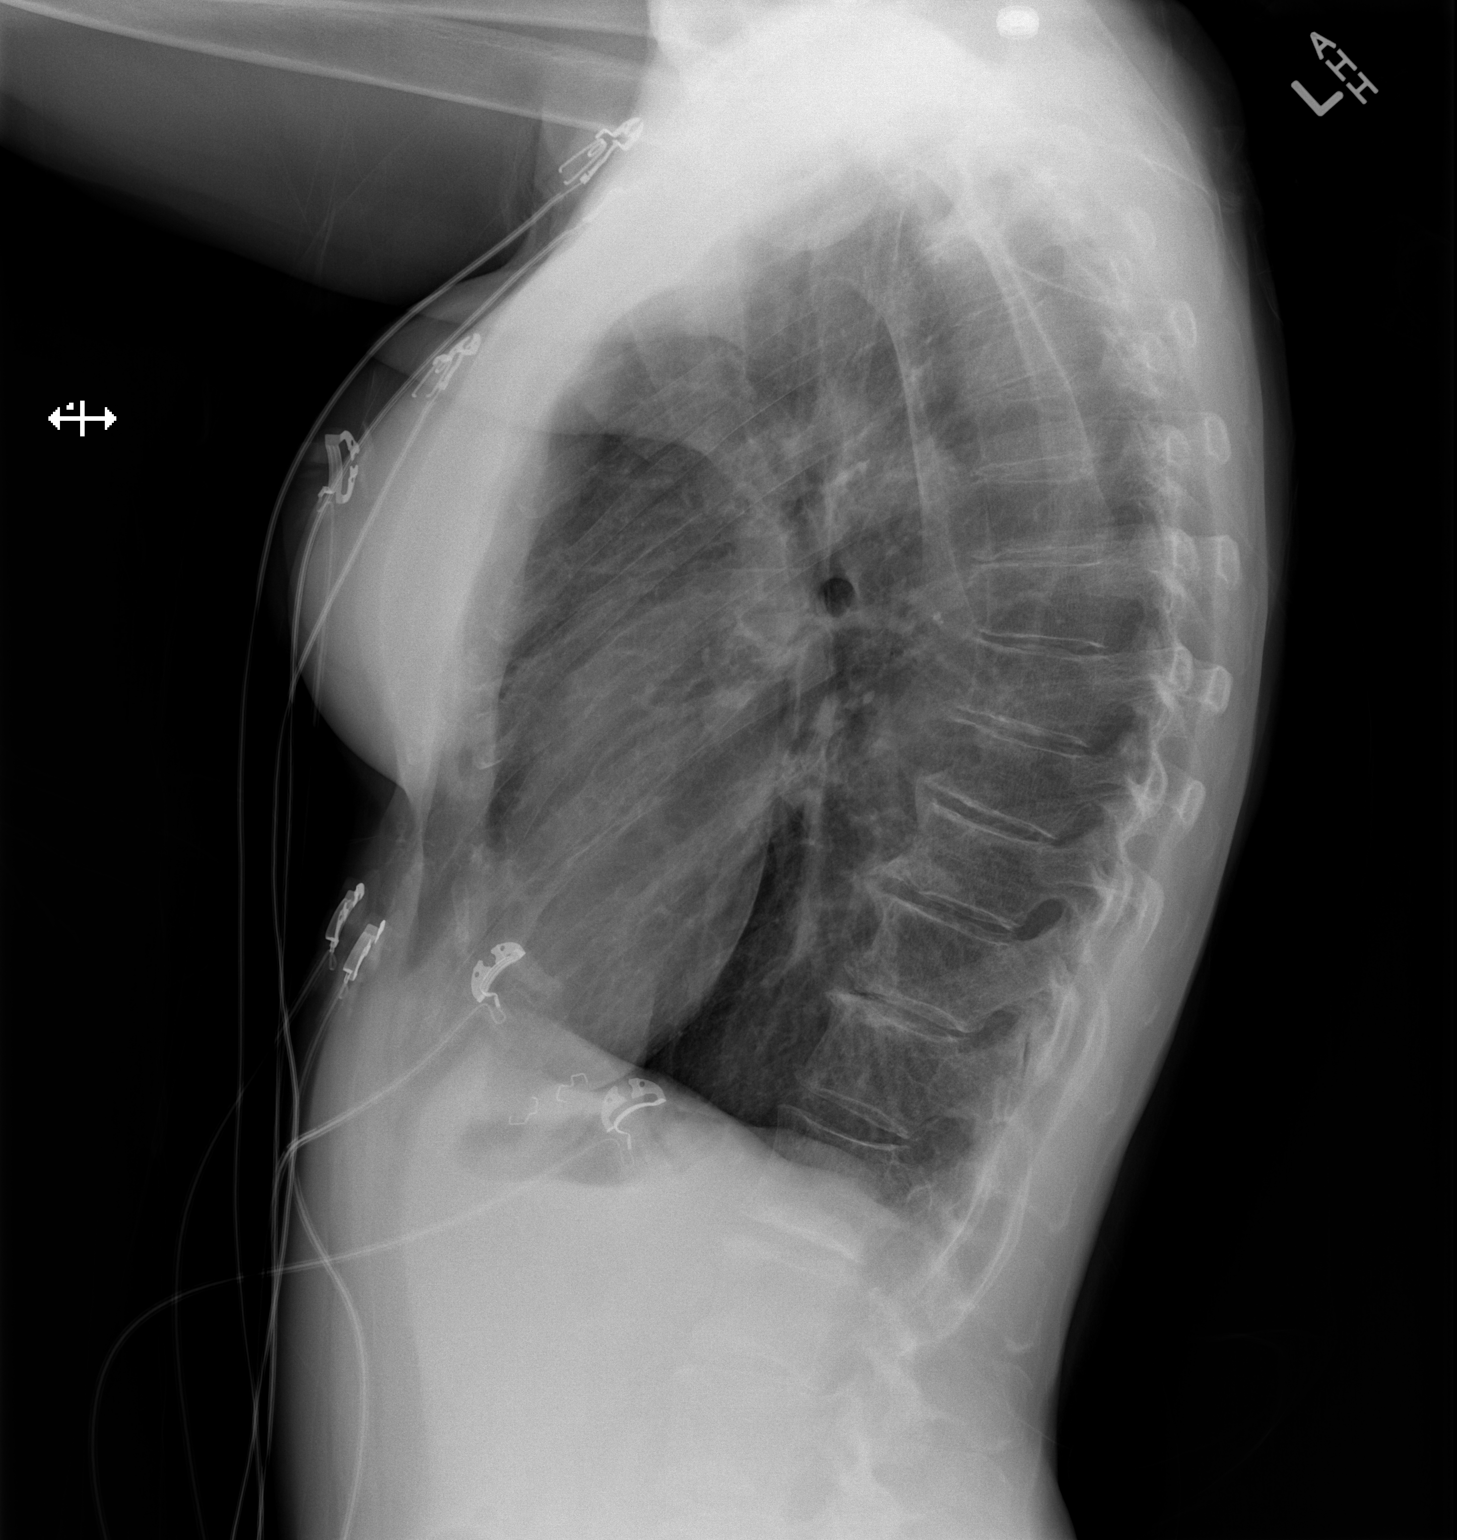

[2 of 2 positions shown; findings below may reference images not displayed]

FINDINGS: The heart size and mediastinal contours are within normal limits.
The lungs are hyperinflated, there is flattening of the
hemidiaphragms and increased AP diameter of the chest. . Both lungs
are clear. Bilateral breast implants. Degenerative changes in the
lower thoracic spine.
IMPRESSION: No active cardiopulmonary disease.  COPD.

## 2022-10-27 ENCOUNTER — Other Ambulatory Visit: Payer: Self-pay

## 2022-10-27 DIAGNOSIS — I824Y9 Acute embolism and thrombosis of unspecified deep veins of unspecified proximal lower extremity: Secondary | ICD-10-CM

## 2022-10-28 ENCOUNTER — Ambulatory Visit (HOSPITAL_COMMUNITY)
Admission: RE | Admit: 2022-10-28 | Discharge: 2022-10-28 | Disposition: A | Discharge status: Home / Self Care | Payer: Medicare Other | Source: Ambulatory Visit | Attending: Internal Medicine | Admitting: Internal Medicine

## 2022-10-28 DIAGNOSIS — I824Y9 Acute embolism and thrombosis of unspecified deep veins of unspecified proximal lower extremity: Secondary | ICD-10-CM | POA: Diagnosis present
